# Patient Record
Sex: Female | Born: 1980 | Race: Black or African American | Hispanic: No | Smoking: Never smoker
Health system: Southern US, Community
[De-identification: ages and names within clinical notes are randomized; demographics above are authoritative.]

## PROBLEM LIST (undated history)

## (undated) DIAGNOSIS — R519 Headache, unspecified: Secondary | ICD-10-CM

## (undated) DIAGNOSIS — Z8619 Personal history of other infectious and parasitic diseases: Secondary | ICD-10-CM

## (undated) DIAGNOSIS — N390 Urinary tract infection, site not specified: Secondary | ICD-10-CM

## (undated) DIAGNOSIS — J45909 Unspecified asthma, uncomplicated: Secondary | ICD-10-CM

## (undated) DIAGNOSIS — R51 Headache: Secondary | ICD-10-CM

## (undated) HISTORY — DX: Headache, unspecified: R51.9

## (undated) HISTORY — PX: BUNIONECTOMY: SHX129

## (undated) HISTORY — DX: Headache: R51

## (undated) HISTORY — DX: Unspecified asthma, uncomplicated: J45.909

## (undated) HISTORY — DX: Personal history of other infectious and parasitic diseases: Z86.19

---

## 2001-12-03 ENCOUNTER — Encounter: Payer: Self-pay | Admitting: Emergency Medicine

## 2001-12-03 ENCOUNTER — Emergency Department (HOSPITAL_COMMUNITY): Admission: EM | Admit: 2001-12-03 | Discharge: 2001-12-03 | Payer: Self-pay | Admitting: Emergency Medicine

## 2001-12-14 ENCOUNTER — Emergency Department (HOSPITAL_COMMUNITY): Admission: EM | Admit: 2001-12-14 | Discharge: 2001-12-14 | Payer: Self-pay | Admitting: Family Medicine

## 2002-04-13 ENCOUNTER — Other Ambulatory Visit: Admission: RE | Admit: 2002-04-13 | Discharge: 2002-04-13 | Payer: Self-pay | Admitting: Obstetrics and Gynecology

## 2002-11-15 ENCOUNTER — Ambulatory Visit (HOSPITAL_COMMUNITY): Admission: RE | Admit: 2002-11-15 | Discharge: 2002-11-15 | Payer: Self-pay | Admitting: *Deleted

## 2002-11-15 ENCOUNTER — Encounter: Payer: Self-pay | Admitting: Obstetrics and Gynecology

## 2002-12-12 ENCOUNTER — Encounter: Payer: Self-pay | Admitting: Obstetrics and Gynecology

## 2002-12-12 ENCOUNTER — Ambulatory Visit (HOSPITAL_COMMUNITY): Admission: RE | Admit: 2002-12-12 | Discharge: 2002-12-12 | Payer: Self-pay | Admitting: Obstetrics and Gynecology

## 2002-12-14 ENCOUNTER — Inpatient Hospital Stay (HOSPITAL_COMMUNITY): Admission: AD | Admit: 2002-12-14 | Discharge: 2002-12-16 | Payer: Self-pay | Admitting: Obstetrics and Gynecology

## 2002-12-18 ENCOUNTER — Encounter: Admission: RE | Admit: 2002-12-18 | Discharge: 2003-01-17 | Payer: Self-pay | Admitting: Obstetrics and Gynecology

## 2003-01-10 ENCOUNTER — Other Ambulatory Visit: Admission: RE | Admit: 2003-01-10 | Discharge: 2003-01-10 | Payer: Self-pay | Admitting: Obstetrics and Gynecology

## 2012-07-15 ENCOUNTER — Encounter (HOSPITAL_COMMUNITY): Payer: Self-pay | Admitting: Emergency Medicine

## 2012-07-15 ENCOUNTER — Emergency Department (INDEPENDENT_AMBULATORY_CARE_PROVIDER_SITE_OTHER)
Admission: EM | Admit: 2012-07-15 | Discharge: 2012-07-15 | Disposition: A | Discharge status: Home / Self Care | Payer: Commercial Managed Care - PPO | Source: Home / Self Care

## 2012-07-15 DIAGNOSIS — B029 Zoster without complications: Secondary | ICD-10-CM

## 2012-07-15 MED ORDER — VALACYCLOVIR HCL 1 G PO TABS: 1000.0000 mg | ORAL_TABLET | Freq: Three times a day (TID) | ORAL | Status: AC

## 2012-07-15 MED ORDER — LIDOCAINE 5 % EX PTCH: 1.0000 | MEDICATED_PATCH | CUTANEOUS | Status: DC

## 2012-07-15 NOTE — ED Provider Notes (Signed)
Medical screening examination/treatment/procedure(s) were performed by resident physician or non-physician practitioner and as supervising physician I was immediately available for consultation/collaboration.   Barkley Bruns MD.    Linna Hoff, MD 07/15/12 2032

## 2012-07-15 NOTE — ED Notes (Signed)
tefla 3x8 used to cover rash.

## 2012-07-15 NOTE — ED Notes (Signed)
Pt is here for a rash posterior of left leg since last Wednesday  Sx include itching, painful Has been applying neosporin for the discomfort w/no relief Denies: f/v/n/d Hx of varicella   She is alert w/no signs of acute distress.

## 2012-07-15 NOTE — ED Provider Notes (Signed)
History     CSN: [id number]  Arrival date & time 07/15/12  1041   None     Chief Complaint  Patient presents with  . Rash    (Consider location/radiation/quality/duration/timing/severity/associated sxs/prior treatment) HPI Comments: 32 year old female that developed a small rash on the left posterior thigh 2 days ago. It started out as small red bumps that were itching. Now there is a crop of bumps that is sore, tender in tingling. It is located to the left posterior thigh near her the midline. She has a history of shingles that was located on the lower lumbar spine some years ago. No constitutional symptoms.   History reviewed. No pertinent past medical history.  History reviewed. No pertinent past surgical history.  No family history on file.  History  Substance Use Topics  . Smoking status: Never Smoker   . Smokeless tobacco: Not on file  . Alcohol Use: No    OB History    Grav Para Term Preterm Abortions TAB SAB Ect Mult Living                  Review of Systems  Skin:       As per history of present illness. No other rashes to  All other systems reviewed and are negative.    Allergies  Review of patient's allergies indicates no known allergies.  Home Medications   Current Outpatient Rx  Name  Route  Sig  Dispense  Refill  . LIDOCAINE 5 % EX PTCH   Transdermal   Place 1 patch onto the skin daily. Remove & Discard patch within 12 hours or as directed by MD   30 patch   0   . VALACYCLOVIR HCL 1 G PO TABS   Oral   Take 1 tablet (1,000 mg total) by mouth 3 (three) times daily.   21 tablet   0     BP 117/60  Pulse 76  Temp 98.8 F (37.1 C) (Oral)  Resp 18  SpO2 100%  LMP 06/29/2012  Physical Exam  Constitutional: She is oriented to person, place, and time. She appears well-developed and well-nourished. No distress.  Neck: Normal range of motion. Neck supple.  Pulmonary/Chest: Effort normal.  Musculoskeletal: Normal range of motion. She  exhibits no edema.  Neurological: She is alert and oriented to person, place, and time. She exhibits normal muscle tone. Coordination normal.  Skin: Skin is warm and dry.       There is a crop of papulovesicular lesions in area with a diameter of approximately 3 cm with an underlying cutaneous erythema extending beyond the vesicles. The area is tender.  Psychiatric: She has a normal mood and affect.    ED Course  Procedures (including critical care time)  Labs Reviewed - No data to display No results found.   1. Herpes zoster       MDM  Valtrex 1 g 3 times a day for 7 days Lidoderm patch q. 12 hours, one per day as needed And keep area covered for protection from clothing Told your physician if needed for worsening new symptoms or problems        Hayden Rasmussen, NP 07/15/12 1221

## 2012-11-04 ENCOUNTER — Emergency Department (HOSPITAL_COMMUNITY)
Admission: EM | Admit: 2012-11-04 | Discharge: 2012-11-04 | Disposition: A | Discharge status: Home / Self Care | Payer: Commercial Managed Care - PPO | Source: Home / Self Care

## 2012-11-04 ENCOUNTER — Encounter (HOSPITAL_COMMUNITY): Payer: Self-pay | Admitting: Emergency Medicine

## 2012-11-04 DIAGNOSIS — J3489 Other specified disorders of nose and nasal sinuses: Secondary | ICD-10-CM

## 2012-11-04 MED ORDER — AMOXICILLIN 500 MG PO CAPS: 500.0000 mg | ORAL_CAPSULE | Freq: Three times a day (TID) | ORAL | Status: DC

## 2012-11-04 NOTE — ED Notes (Signed)
Patient complains of sinus pain, drainage, and pressure.  Pain in center of face, particularly left side of face/nose.  Reports runny nose and body aches, and denies fever.

## 2012-11-04 NOTE — ED Provider Notes (Signed)
History     CSN: [id number]  Arrival date & time 11/04/12  1604   None     Chief Complaint  Patient presents with  . Facial Pain    (Consider location/radiation/quality/duration/timing/severity/associated sxs/prior treatment) Patient is a 32 y.o. female presenting with cough. The history is provided by the patient. No language interpreter was used.  Cough Cough characteristics:  Non-productive Severity:  Moderate Onset quality:  Gradual Timing:  Constant Progression:  Worsening Chronicity:  New Relieved by:  Nothing Worsened by:  Nothing tried Associated symptoms: shortness of breath, sinus congestion and sore throat   Associated symptoms: no chest pain    Pt complains of sinus congestion and drainage.  Pt complains of facial pain.   Pt has tried Careers adviser without relief No past medical history on file.  No past surgical history on file.  No family history on file.  History  Substance Use Topics  . Smoking status: Never Smoker   . Smokeless tobacco: Not on file  . Alcohol Use: No    OB History   Grav Para Term Preterm Abortions TAB SAB Ect Mult Living                  Review of Systems  HENT: Positive for sore throat.   Respiratory: Positive for cough and shortness of breath.   Cardiovascular: Negative for chest pain.  All other systems reviewed and are negative.    Allergies  Review of patient's allergies indicates no known allergies.  Home Medications   Current Outpatient Rx  Name  Route  Sig  Dispense  Refill  . lidocaine (LIDODERM) 5 %   Transdermal   Place 1 patch onto the skin daily. Remove & Discard patch within 12 hours or as directed by MD   30 patch   0     BP 133/73  Pulse 64  Temp(Src) 97.7 F (36.5 C) (Oral)  Resp 16  SpO2 100%  Physical Exam  Vitals reviewed. Constitutional: She is oriented to person, place, and time. She appears well-developed and well-nourished.  HENT:  Head: Normocephalic.  Right Ear: External ear  normal.  Left Ear: External ear normal.  Mouth/Throat: Oropharynx is clear and moist.  Tender bilat maxillay sinuses  Eyes: Conjunctivae are normal. Pupils are equal, round, and reactive to light.  Neck: Normal range of motion. Neck supple.  Cardiovascular: Normal rate.   Pulmonary/Chest: Effort normal.  Neurological: She is alert and oriented to person, place, and time.  Skin: Skin is warm.  Psychiatric: She has a normal mood and affect.    ED Course  Procedures (including critical care time)  Labs Reviewed - No data to display No results found.   1. Sinus pain       MDM  Amoxicillian Pt advised to take zyrtec,        Elson Areas, PA-C 11/04/12 screening examination/treatment/ procedure(s) were performed by non-physician practitioner and as supervising physician I was immediately available for consultations/colaborattion.   [employer] [place],M.D.  Duwayne Heck de Marcello Moores, MD 11/04/12 [phone]

## 2012-12-15 ENCOUNTER — Emergency Department (HOSPITAL_COMMUNITY)
Admission: EM | Admit: 2012-12-15 | Discharge: 2012-12-15 | Disposition: A | Discharge status: Home / Self Care | Payer: Commercial Managed Care - PPO | Source: Home / Self Care | Attending: Emergency Medicine | Admitting: Emergency Medicine

## 2012-12-15 ENCOUNTER — Encounter (HOSPITAL_COMMUNITY): Payer: Self-pay | Admitting: Emergency Medicine

## 2012-12-15 DIAGNOSIS — B029 Zoster without complications: Secondary | ICD-10-CM

## 2012-12-15 MED ORDER — LIDOCAINE 5 % EX PTCH: 1.0000 | MEDICATED_PATCH | CUTANEOUS | Status: DC

## 2012-12-15 MED ORDER — VALACYCLOVIR HCL 1 G PO TABS: 1000.0000 mg | ORAL_TABLET | Freq: Three times a day (TID) | ORAL | Status: AC

## 2012-12-15 NOTE — ED Notes (Signed)
When obtaining vitals, patient requested to not sit, wanted to stand due to the rash on the posterior legs. Vitals obtained standing

## 2012-12-15 NOTE — ED Provider Notes (Signed)
History    CSN: [id number] Arrival date & time 12/15/12  0914  First MD Initiated Contact with Patient 12/15/12 [phone]     Chief Complaint  Patient presents with  . Herpes Zoster   (Consider location/radiation/quality/duration/timing/severity/associated sxs/prior Treatment) HPI Comments: 32 year old female presents complaining of outbreak of shingles. She was seen for this originally back in January. She states that this new outbreak is in the exact same place on the back of her left leg. She denies any differences between the last outbreak and this. She denies any systemic symptoms including no fever, chills, or rash elsewhere.  History reviewed. No pertinent past medical history. History reviewed. No pertinent past surgical history. No family history on file. History  Substance Use Topics  . Smoking status: Never Smoker   . Smokeless tobacco: Not on file  . Alcohol Use: No   OB History   Grav Para Term Preterm Abortions TAB SAB Ect Mult Living                 Review of Systems  Constitutional: Negative for fever and chills.  Eyes: Negative for visual disturbance.  Respiratory: Negative for cough and shortness of breath.   Cardiovascular: Negative for chest pain, palpitations and leg swelling.  Gastrointestinal: Negative for nausea, vomiting and abdominal pain.  Endocrine: Negative for polydipsia and polyuria.  Genitourinary: Negative for dysuria, urgency and frequency.  Musculoskeletal: Negative for myalgias and arthralgias.  Skin: Positive for rash.  Neurological: Negative for dizziness, weakness and light-headedness.    Allergies  Review of patient's allergies indicates no known allergies.  Home Medications   Current Outpatient Rx  Name  Route  Sig  Dispense  Refill  . amoxicillin (AMOXIL) 500 MG capsule   Oral   Take 1 capsule (500 mg total) by mouth 3 (three) times daily.   30 capsule   0   . lidocaine (LIDODERM) 5 %   Transdermal   Place 1 patch onto the  skin daily. Remove & Discard patch within 12 hours or as directed by MD   30 patch   0   . valACYclovir (VALTREX) 1000 MG tablet   Oral   Take 1 tablet (1,000 mg total) by mouth 3 (three) times daily.   21 tablet   0    BP 166/79  Pulse 75  Temp(Src) 99 F (37.2 C) (Oral)  Resp 18  LMP 11/26/2012 Physical Exam  Nursing note and vitals reviewed. Constitutional: She is oriented to person, place, and time. Vital signs are normal. She appears well-developed and well-nourished. No distress.  HENT:  Head: Atraumatic.  Eyes: EOM are normal. Pupils are equal, round, and reactive to light.  Pulmonary/Chest: Effort normal. No respiratory distress.  Neurological: She is alert and oriented to person, place, and time. She has normal strength.  Skin: Skin is warm and dry. Rash noted. Rash is vesicular (confluence of erythematous vesicles on the upper left thigh). She is not diaphoretic.  Psychiatric: She has a normal mood and affect. Her behavior is normal. Judgment normal.    ED Course  Procedures (including critical care time) Labs Reviewed - No data to display No results found. 1. Shingles     MDM  We'll treat with 3 times a day Valtrex as well as Lidoderm patch. She will followup with her primary care provider after this has resolved for consideration for Zostavax.   Meds ordered this encounter  Medications  . lidocaine (LIDODERM) 5 %    Sig: Place 1  patch onto the skin daily. Remove & Discard patch within 12 hours or as directed by MD    Dispense:  30 patch    Refill:  0    Order Specific Question:  Supervising Provider    Answer:  Linna Hoff [phone]  . valACYclovir (VALTREX) 1000 MG tablet    Sig: Take 1 tablet (1,000 mg total) by mouth 3 (three) times daily.    Dispense:  21 tablet    Refill:  0     Graylon Good, PA-C 12/15/12 1009

## 2012-12-15 NOTE — ED Provider Notes (Signed)
Medical screening examination/treatment/procedure(s) were performed by non-physician practitioner and as supervising physician I was immediately available for consultation/collaboration.  Leslee Home, M.D.  Reuben Likes, MD 12/15/12 1311

## 2012-12-15 NOTE — ED Notes (Addendum)
C/o shingles.  Patient has a small area on posterior left thigh reported as painful and recurrent.  Last episode in february

## 2013-03-27 ENCOUNTER — Encounter (HOSPITAL_COMMUNITY): Payer: Self-pay | Admitting: Emergency Medicine

## 2013-03-27 ENCOUNTER — Emergency Department (INDEPENDENT_AMBULATORY_CARE_PROVIDER_SITE_OTHER)
Admission: EM | Admit: 2013-03-27 | Discharge: 2013-03-27 | Disposition: A | Discharge status: Home / Self Care | Payer: Commercial Managed Care - PPO | Source: Home / Self Care | Attending: Emergency Medicine | Admitting: Emergency Medicine

## 2013-03-27 DIAGNOSIS — N3000 Acute cystitis without hematuria: Secondary | ICD-10-CM

## 2013-03-27 HISTORY — DX: Urinary tract infection, site not specified: N39.0

## 2013-03-27 LAB — POCT URINALYSIS DIP (DEVICE)
Bilirubin Urine: NEGATIVE
Glucose, UA: NEGATIVE mg/dL
Hgb urine dipstick: NEGATIVE
Nitrite: NEGATIVE
Specific Gravity, Urine: >=1.03 (ref 1.005–1.030)

## 2013-03-27 LAB — POCT PREGNANCY, URINE: Preg Test, Ur: NEGATIVE

## 2013-03-27 MED ORDER — CEPHALEXIN 500 MG PO CAPS: 500.0000 mg | ORAL_CAPSULE | Freq: Three times a day (TID) | ORAL | Status: DC

## 2013-03-27 MED ORDER — PHENAZOPYRIDINE HCL 200 MG PO TABS: 200.0000 mg | ORAL_TABLET | Freq: Three times a day (TID) | ORAL | Status: DC | PRN

## 2013-03-27 NOTE — ED Provider Notes (Signed)
Chief Complaint:   Chief Complaint  Patient presents with  . Urinary Tract Infection    History of Present Illness:   Whitney [name] is a 32 year old female who has had a six-day history of urinary frequency, dysuria, and a small amount of blood in her urine. She's had some lower abdominal and lower back pain and felt chilled. She denies any fever, nausea, vomiting, or GYN complaints. Her last menstrual period was one week ago. She is sexually active and has a Mirena intrauterine device. She had a urinary tract infection about 5 years ago.  Review of Systems:  Other than noted above, the patient denies any of the following symptoms: General:  No fevers, chills, sweats, aches, or fatigue. GI:  No abdominal pain, back pain, nausea, vomiting, diarrhea, or constipation. GU:  No dysuria, frequency, urgency, hematuria, or incontinence. GYN:  No discharge, itching, vulvar pain or lesions, pelvic pain, or abnormal vaginal bleeding.  PMFSH:  Past medical history, family history, social history, meds, and allergies were reviewed.    Physical Exam:   Vital signs:  BP 124/92  Pulse 62  Temp(Src) 99 F (37.2 C) (Oral)  Resp 16  SpO2 100%  LMP 03/27/2013 Gen:  Alert, oriented, in no distress. Lungs:  Clear to auscultation, no wheezes, rales or rhonchi. Heart:  Regular rhythm, no gallop or murmer. Abdomen:  Flat and soft. There was slight suprapubic pain to palpation.  No guarding, or rebound.  No hepato-splenomegaly or mass.  Bowel sounds were normally active.  No hernia. Back:  No CVA tenderness.  Skin:  Clear, warm and dry.  Labs:    Results for orders placed during the hospital encounter of 03/27/13  POCT URINALYSIS DIP (DEVICE)      Result Value Range   Glucose, UA NEGATIVE  NEGATIVE mg/dL   Bilirubin Urine NEGATIVE  NEGATIVE   Ketones, ur NEGATIVE  NEGATIVE mg/dL   Specific Gravity, Urine >=1.030  1.005 - 1.030   Hgb urine dipstick NEGATIVE  NEGATIVE   pH 6.0  5.0 - 8.0   Protein, ur  NEGATIVE  NEGATIVE mg/dL   Urobilinogen, UA 0.2  0.0 - 1.0 mg/dL   Nitrite NEGATIVE  NEGATIVE   Leukocytes, UA TRACE (*) NEGATIVE  POCT PREGNANCY, URINE      Result Value Range   Preg Test, Ur NEGATIVE  NEGATIVE     A urine culture was obtained.  Results are pending at this time and we will call about any positive results.  Assessment: The encounter diagnosis was Acute cystitis.   No evidence of pyelonephritis.  Plan:   1.  Meds:  The following meds were prescribed:   Discharge Medication List as of 03/27/2013  4:56 PM    START taking these medications   Details  cephALEXin (KEFLEX) 500 MG capsule Take 1 capsule (500 mg total) by mouth 3 (three) times daily., Starting 03/27/2013, Until Discontinued, Normal    phenazopyridine (PYRIDIUM) 200 MG tablet Take 1 tablet (200 mg total) by mouth 3 (three) times daily as needed for pain., Starting 03/27/2013, Until Discontinued, Normal        2.  Patient Education/Counseling:  The patient was given appropriate handouts, self care instructions, and instructed in symptomatic relief. The patient was told to avoid intercourse for 10 days, get extra fluids, and return for a follow up with her primary care doctor at the completion of treatment for a repeat UA and culture.    3.  Follow up:  The patient was  told to follow up if no better in 3 to 4 days, if becoming worse in any way, and given some red flag symptoms such as fever, vomiting, or increasing pain which would prompt immediate return.  Follow up here if necessary.     Reuben Likes, MD 03/27/13 1728

## 2013-03-27 NOTE — ED Notes (Signed)
uti onset Wednesday, history of the same. Reports initially feltburning with urination, blood in urine, nausea, chills and now frequency.

## 2013-03-29 LAB — URINE CULTURE: Special Requests: NORMAL

## 2013-03-29 NOTE — ED Notes (Signed)
Urine culture: >100,000 colonies E. Coli.  Pt. treated adequately with Keflex.   Whitney [name] 03/29/2013

## 2013-05-15 ENCOUNTER — Other Ambulatory Visit: Payer: Self-pay | Admitting: Obstetrics and Gynecology

## 2014-01-30 LAB — HM PAP SMEAR: HM Pap smear: NEGATIVE

## 2014-08-03 ENCOUNTER — Emergency Department (HOSPITAL_COMMUNITY)
Admission: EM | Admit: 2014-08-03 | Discharge: 2014-08-03 | Disposition: A | Discharge status: Home / Self Care | Payer: Commercial Managed Care - PPO | Source: Home / Self Care | Attending: Family Medicine | Admitting: Family Medicine

## 2014-08-03 ENCOUNTER — Encounter (HOSPITAL_COMMUNITY): Payer: Self-pay | Admitting: Emergency Medicine

## 2014-08-03 DIAGNOSIS — L03115 Cellulitis of right lower limb: Secondary | ICD-10-CM

## 2014-08-03 NOTE — ED Notes (Signed)
Pt states that she believes she was bite by a spider on 07/26/2014 pt states se didn't actually see if it was spider but she felt something bite her leg. Pt has small scab on lower right leg.

## 2014-08-03 NOTE — ED Provider Notes (Signed)
CSN: [id number]     Arrival date & time 08/03/14  0844 History   First MD Initiated Contact with Patient 08/03/14 [phone]     Chief Complaint  Patient presents with  . Insect Bite    spider bite   (Consider location/radiation/quality/duration/timing/severity/associated sxs/prior Treatment) HPI      34 year old female presents for evaluation of possible spider bite. She was bitten on her right shin a week ago.  It became swollen, erythematous, and tender. She squeezed it and a large amount of pus came out. It is now getting better. She decided to come in because her friends told her that if she didnt get treated her leg might have to be amputated. She now has minimal pain, no systemic symptoms whatsoever.  Past Medical History  Diagnosis Date  . UTI (lower urinary tract infection)    History reviewed. No pertinent past surgical history. History reviewed. No pertinent family history. History  Substance Use Topics  . Smoking status: Never Smoker   . Smokeless tobacco: Not on file  . Alcohol Use: No   OB History    No data available     Review of Systems  Skin: Positive for wound (right shin ).  All other systems reviewed and are negative.   Allergies  Review of patient's allergies indicates no known allergies.  Home Medications   Prior to Admission medications   Medication Sig Start Date End Date Taking? Authorizing Provider  amoxicillin (AMOXIL) 500 MG capsule Take 1 capsule (500 mg total) by mouth 3 (three) times daily. 11/04/12   Fransico Meadow, PA-C  cephALEXin (KEFLEX) 500 MG capsule Take 1 capsule (500 mg total) by mouth 3 (three) times daily. 03/27/13   Harden Mo, MD  lidocaine (LIDODERM) 5 % Place 1 patch onto the skin daily. Remove & Discard patch within 12 hours or as directed by MD 12/15/12   Liam Graham, PA-C  phenazopyridine (PYRIDIUM) 200 MG tablet Take 1 tablet (200 mg total) by mouth 3 (three) times daily as needed for pain. 03/27/13   Harden Mo, MD    BP 114/51 mmHg  Pulse 69  Temp(Src) 97.4 F (36.3 C) (Oral)  Resp 18  SpO2 98%  LMP 07/09/2014 Physical Exam  Constitutional: She is oriented to person, place, and time. Vital signs are normal. She appears well-developed and well-nourished. No distress.  HENT:  Head: Normocephalic and atraumatic.  Pulmonary/Chest: Effort normal. No respiratory distress.  Neurological: She is alert and oriented to person, place, and time. She has normal strength. Coordination normal.  Skin: Skin is warm and dry. Lesion (on the right shin, there is a small 3 mm diameter scab with very minimal surrounding erythema and induration, minimally tender. She confirms that this appears much better than it has over the past few days and it is getting better every day.) noted. No rash noted. She is not diaphoretic.  Psychiatric: She has a normal mood and affect. Judgment normal.  Nursing note and vitals reviewed.   ED Course  Procedures (including critical care time) Labs Review Labs Reviewed - No data to display  Imaging Review No results found.   MDM   1. Cellulitis of right lower extremity    Advised warm compresses, elevation, and follow-up if it starts to worsen at all. No antibiotics indicated at this time      Liam Graham, PA-C 08/03/14 2836

## 2014-08-03 NOTE — Discharge Instructions (Signed)
Use warm compresses 3 times daily. Come back if this starts getting worse to be reevaluated and considered for antibiotics  Cellulitis Cellulitis is an infection of the skin and the tissue beneath it. The infected area is usually red and tender. Cellulitis occurs most often in the arms and lower legs.  CAUSES  Cellulitis is caused by bacteria that enter the skin through cracks or cuts in the skin. The most common types of bacteria that cause cellulitis are staphylococci and streptococci. SIGNS AND SYMPTOMS   Redness and warmth.  Swelling.  Tenderness or pain.  Fever. DIAGNOSIS  Your health care provider can usually determine what is wrong based on a physical exam. Blood tests may also be done. TREATMENT  Treatment usually involves taking an antibiotic medicine. HOME CARE INSTRUCTIONS   Take your antibiotic medicine as directed by your health care provider. Finish the antibiotic even if you start to feel better.  Keep the infected arm or leg elevated to reduce swelling.  Apply a warm cloth to the affected area up to 4 times per day to relieve pain.  Take medicines only as directed by your health care provider.  Keep all follow-up visits as directed by your health care provider. SEEK MEDICAL CARE IF:   You notice red streaks coming from the infected area.  Your red area gets larger or turns dark in color.  Your bone or joint underneath the infected area becomes painful after the skin has healed.  Your infection returns in the same area or another area.  You notice a swollen bump in the infected area.  You develop new symptoms.  You have a fever. SEEK IMMEDIATE MEDICAL CARE IF:   You feel very sleepy.  You develop vomiting or diarrhea.  You have a general ill feeling (malaise) with muscle aches and pains. MAKE SURE YOU:   Understand these instructions.  Will watch your condition.  Will get help right away if you are not doing well or get worse. Document  Released: 03/11/2005 Document Revised: 10/16/2013 Document Reviewed: 08/17/2011 [facility] Patient Information 2015 [place], [place]. This information is not intended to replace advice given to you by your health care provider. Make sure you discuss any questions you have with your health care provider.  Abscess An abscess is an infected area that contains a collection of pus and debris.It can occur in almost any part of the body. An abscess is also known as a furuncle or boil. CAUSES  An abscess occurs when tissue gets infected. This can occur from blockage of oil or sweat glands, infection of hair follicles, or a minor injury to the skin. As the body tries to fight the infection, pus collects in the area and creates pressure under the skin. This pressure causes pain. People with weakened immune systems have difficulty fighting infections and get certain abscesses more often.  SYMPTOMS Usually an abscess develops on the skin and becomes a painful mass that is red, warm, and tender. If the abscess forms under the skin, you may feel a moveable soft area under the skin. Some abscesses break open (rupture) on their own, but most will continue to get worse without care. The infection can spread deeper into the body and eventually into the bloodstream, causing you to feel ill.  DIAGNOSIS  Your caregiver will take your medical history and perform a physical exam. A sample of fluid may also be taken from the abscess to determine what is causing your infection. TREATMENT  Your caregiver may prescribe  antibiotic medicines to fight the infection. However, taking antibiotics alone usually does not cure an abscess. Your caregiver may need to make a small cut (incision) in the abscess to drain the pus. In some cases, gauze is packed into the abscess to reduce pain and to continue draining the area. HOME CARE INSTRUCTIONS   Only take over-the-counter or prescription medicines for pain, discomfort, or fever as  directed by your caregiver.  If you were prescribed antibiotics, take them as directed. Finish them even if you start to feel better.  If gauze is used, follow your caregiver's directions for changing the gauze.  To avoid spreading the infection:  Keep your draining abscess covered with a bandage.  Wash your hands well.  Do not share personal care items, towels, or whirlpools with others.  Avoid skin contact with others.  Keep your skin and clothes clean around the abscess.  Keep all follow-up appointments as directed by your caregiver. SEEK MEDICAL CARE IF:   You have increased pain, swelling, redness, fluid drainage, or bleeding.  You have muscle aches, chills, or a general ill feeling.  You have a fever. MAKE SURE YOU:   Understand these instructions.  Will watch your condition.  Will get help right away if you are not doing well or get worse. Document Released: 03/11/2005 Document Revised: 12/01/2011 Document Reviewed: 08/14/2011 [facility] Patient Information 2015 [place], [place]. This information is not intended to replace advice given to you by your health care provider. Make sure you discuss any questions you have with your health care provider.

## 2014-12-07 ENCOUNTER — Emergency Department (HOSPITAL_COMMUNITY)
Admission: EM | Admit: 2014-12-07 | Discharge: 2014-12-07 | Discharge status: Absent without leave | Payer: Commercial Managed Care - PPO | Source: Home / Self Care

## 2015-03-03 ENCOUNTER — Emergency Department (INDEPENDENT_AMBULATORY_CARE_PROVIDER_SITE_OTHER)
Admission: EM | Admit: 2015-03-03 | Discharge: 2015-03-03 | Disposition: A | Discharge status: Home / Self Care | Payer: Commercial Managed Care - PPO | Source: Home / Self Care

## 2015-03-03 ENCOUNTER — Encounter (HOSPITAL_COMMUNITY): Payer: Self-pay | Admitting: Emergency Medicine

## 2015-03-03 DIAGNOSIS — G43809 Other migraine, not intractable, without status migrainosus: Secondary | ICD-10-CM

## 2015-03-03 MED ORDER — SUMATRIPTAN SUCCINATE 50 MG PO TABS: ORAL_TABLET | ORAL | Status: DC

## 2015-03-03 MED ORDER — PROMETHAZINE HCL 25 MG PO TABS: 25.0000 mg | ORAL_TABLET | Freq: Four times a day (QID) | ORAL | Status: DC | PRN

## 2015-03-03 NOTE — Discharge Instructions (Signed)

## 2015-03-03 NOTE — ED Provider Notes (Signed)
CSN: [id number]     Arrival date & time 03/03/15  1746 History   None    Chief Complaint  Patient presents with  . Dizziness  . Migraine   (Consider location/radiation/quality/duration/timing/severity/associated sxs/prior Treatment) Patient is a 34 y.o. female presenting with headaches. The history is provided by the patient. No language interpreter was used.  Headache Pain location:  Frontal (B/L temporal and frontal) Quality:  Dull Severity currently:  8/10 Onset quality:  Gradual Duration:  2 weeks Timing:  Intermittent Progression:  Waxing and waning Chronicity:  Recurrent Similar to prior headaches: yes (This is worse)   Context: activity   Context: not exposure to bright light and not loud noise   Context comment:  Stress at work Relieved by:  NSAIDs (Ibuprofen 800mg ) Worsened by:  Activity Ineffective treatments:  NSAIDs Associated symptoms: dizziness   Associated symptoms: no blurred vision, no fever, no neck stiffness, no numbness, no photophobia and no seizures   Associated symptoms comment:  Feels dizzy intermittently.  She also mentioned she recently changes her eye glasses and contact lens few days ago and she is now adjusting to her new lenses. NB: On Mirena for birth control per patient.  Past Medical History  Diagnosis Date  . UTI (lower urinary tract infection)    History reviewed. No pertinent past surgical history. No family history on file. Social History  Substance Use Topics  . Smoking status: Never Smoker   . Smokeless tobacco: None  . Alcohol Use: No   OB History    No data available     Review of Systems  Constitutional: Negative for fever.  Eyes: Negative for blurred vision and photophobia.  Respiratory: Negative.   Cardiovascular: Negative.   Musculoskeletal: Negative for neck stiffness.  Neurological: Positive for dizziness and headaches. Negative for tremors, seizures and numbness.  All other systems reviewed and are  negative.   Allergies  Review of patient's allergies indicates no known allergies.  Home Medications   Prior to Admission medications   Medication Sig Start Date End Date Taking? Authorizing Provider  amoxicillin (AMOXIL) 500 MG capsule Take 1 capsule (500 mg total) by mouth 3 (three) times daily. 11/04/12   Fransico Meadow, PA-C  cephALEXin (KEFLEX) 500 MG capsule Take 1 capsule (500 mg total) by mouth 3 (three) times daily. 03/27/13   Harden Mo, MD  lidocaine (LIDODERM) 5 % Place 1 patch onto the skin daily. Remove & Discard patch within 12 hours or as directed by MD 12/15/12   Liam Graham, PA-C  phenazopyridine (PYRIDIUM) 200 MG tablet Take 1 tablet (200 mg total) by mouth 3 (three) times daily as needed for pain. 03/27/13   Harden Mo, MD   Meds Ordered and Administered this Visit  Medications - No data to display  BP 101/57 mmHg  Pulse 60  Temp(Src) 98.7 F (37.1 C) (Oral)  Resp 16  SpO2 100% No data found.   Physical Exam  Constitutional: She is oriented to person, place, and time. She appears well-developed. No distress.  HENT:  Right Ear: Tympanic membrane, external ear and ear canal normal.  Left Ear: Tympanic membrane, external ear and ear canal normal.  Eyes: Conjunctivae and EOM are normal. Pupils are equal, round, and reactive to light.  Fundoscopic exam:      The right eye shows no hemorrhage and no papilledema.       The left eye shows no hemorrhage and no papilledema.  Cardiovascular: Normal rate, regular rhythm, normal  heart sounds and intact distal pulses.   No murmur heard. Pulmonary/Chest: Effort normal and breath sounds normal. No respiratory distress. She has no wheezes.  Abdominal: Soft. Bowel sounds are normal. She exhibits no distension. There is no tenderness.  Musculoskeletal: Normal range of motion.  Neurological: She is alert and oriented to person, place, and time. She has normal strength and normal reflexes. She is not disoriented. No  cranial nerve deficit or sensory deficit. She displays a negative Romberg sign. GCS eye subscore is 4. GCS verbal subscore is 5. GCS motor subscore is 6. She displays no Babinski's sign on the right side. She displays no Babinski's sign on the left side.  Neg Kernig's sign.  Nursing note and vitals reviewed.   ED Course  Procedures (including critical care time)  Labs Review Labs Reviewed - No data to display  Imaging Review No results found.   Visual Acuity Review  Right Eye Distance:   Left Eye Distance:   Bilateral Distance:    Right Eye Near:   Left Eye Near:    Bilateral Near:         MDM  No diagnosis found. Migraine Headache  Patient likely having migraine headache vs due to not wearing corrected lenses for her vision vs NSAID overuse. For now I will treat her for Migraine. Trial of Imitrex recommended. Prescription given. I also gave Phenergan for her nausea. Patient advised to keep hydrated and rest at home. Return precaution discussed.  Kinnie Feil, MD 03/04/15 [phone]

## 2015-03-03 NOTE — ED Notes (Signed)
Patient reports she has felt dizzy with nausea x 2 weeks. She also c/o headaches with tension behind her eyes. She has been switching between glasses and contacts lately. Patient is in NAD.

## 2016-04-09 ENCOUNTER — Ambulatory Visit (INDEPENDENT_AMBULATORY_CARE_PROVIDER_SITE_OTHER): Payer: Commercial Managed Care - PPO | Admitting: Nurse Practitioner

## 2016-04-09 ENCOUNTER — Other Ambulatory Visit (INDEPENDENT_AMBULATORY_CARE_PROVIDER_SITE_OTHER): Payer: Commercial Managed Care - PPO

## 2016-04-09 ENCOUNTER — Encounter: Payer: Self-pay | Admitting: Nurse Practitioner

## 2016-04-09 VITALS — BP 116/89 | Temp 97.6°F | Ht 67.0 in | Wt 223.0 lb

## 2016-04-09 DIAGNOSIS — Z23 Encounter for immunization: Secondary | ICD-10-CM | POA: Diagnosis not present

## 2016-04-09 DIAGNOSIS — Z0000 Encounter for general adult medical examination without abnormal findings: Secondary | ICD-10-CM

## 2016-04-09 DIAGNOSIS — R8290 Unspecified abnormal findings in urine: Secondary | ICD-10-CM

## 2016-04-09 DIAGNOSIS — Z87898 Personal history of other specified conditions: Secondary | ICD-10-CM | POA: Diagnosis not present

## 2016-04-09 DIAGNOSIS — N921 Excessive and frequent menstruation with irregular cycle: Secondary | ICD-10-CM

## 2016-04-09 DIAGNOSIS — R102 Pelvic and perineal pain: Secondary | ICD-10-CM

## 2016-04-09 DIAGNOSIS — Z0001 Encounter for general adult medical examination with abnormal findings: Secondary | ICD-10-CM | POA: Diagnosis not present

## 2016-04-09 DIAGNOSIS — G43009 Migraine without aura, not intractable, without status migrainosus: Secondary | ICD-10-CM | POA: Diagnosis not present

## 2016-04-09 DIAGNOSIS — R1024 Suprapubic pain: Secondary | ICD-10-CM

## 2016-04-09 DIAGNOSIS — G43909 Migraine, unspecified, not intractable, without status migrainosus: Secondary | ICD-10-CM | POA: Insufficient documentation

## 2016-04-09 DIAGNOSIS — F909 Attention-deficit hyperactivity disorder, unspecified type: Secondary | ICD-10-CM | POA: Insufficient documentation

## 2016-04-09 LAB — POCT URINALYSIS DIPSTICK
BILIRUBIN UA: NEGATIVE
GLUCOSE UA: NEGATIVE
KETONES UA: NEGATIVE
LEUKOCYTES UA: NEGATIVE
NITRITE UA: NEGATIVE
Protein, UA: NEGATIVE
Spec Grav, UA: >=1.03
Urobilinogen, UA: 1
pH, UA: 6

## 2016-04-09 LAB — COMPREHENSIVE METABOLIC PANEL
ALBUMIN: 4.2 g/dL (ref 3.5–5.2)
ALK PHOS: 32 U/L — AB (ref 39–117)
ALT: 15 U/L (ref 0–35)
AST: 15 U/L (ref 0–37)
BILIRUBIN TOTAL: 0.4 mg/dL (ref 0.2–1.2)
BUN: 10 mg/dL (ref 6–23)
CALCIUM: 9.3 mg/dL (ref 8.4–10.5)
CO2: 28 meq/L (ref 19–32)
CREATININE: 0.79 mg/dL (ref 0.40–1.20)
Chloride: 105 mEq/L (ref 96–112)
GFR: 105.16 mL/min (ref >60.00)
Glucose, Bld: 102 mg/dL — ABNORMAL HIGH (ref 70–99)
Potassium: 4.1 mEq/L (ref 3.5–5.1)
Sodium: 139 mEq/L (ref 135–145)
TOTAL PROTEIN: 7.5 g/dL (ref 6.0–8.3)

## 2016-04-09 LAB — CBC WITH DIFFERENTIAL/PLATELET
BASOS ABS: 0 10*3/uL (ref 0.0–0.1)
Basophils Relative: 0.3 % (ref 0.0–3.0)
EOS ABS: 0.2 10*3/uL (ref 0.0–0.7)
Eosinophils Relative: 3.9 % (ref 0.0–5.0)
HEMATOCRIT: 37.3 % (ref 36.0–46.0)
Hemoglobin: 12.5 g/dL (ref 12.0–15.0)
LYMPHS PCT: 22.4 % (ref 12.0–46.0)
Lymphs Abs: 1.2 10*3/uL (ref 0.7–4.0)
MCHC: 33.6 g/dL (ref 30.0–36.0)
MCV: 91.3 fl (ref 78.0–100.0)
Monocytes Absolute: 0.5 10*3/uL (ref 0.1–1.0)
Monocytes Relative: 8.9 % (ref 3.0–12.0)
NEUTROS ABS: 3.5 10*3/uL (ref 1.4–7.7)
NEUTROS PCT: 64.5 % (ref 43.0–77.0)
PLATELETS: 294 10*3/uL (ref 150.0–400.0)
RBC: 4.09 Mil/uL (ref 3.87–5.11)
RDW: 13.5 % (ref 11.5–15.5)
WBC: 5.4 10*3/uL (ref 4.0–10.5)

## 2016-04-09 LAB — LIPID PANEL
CHOL/HDL RATIO: 4
Cholesterol: 168 mg/dL (ref 0–200)
HDL: 47.3 mg/dL (ref >39.00)
LDL Cholesterol: 107 mg/dL — ABNORMAL HIGH (ref 0–99)
NonHDL: 120.65
TRIGLYCERIDES: 66 mg/dL (ref 0.0–149.0)
VLDL: 13.2 mg/dL (ref 0.0–40.0)

## 2016-04-09 LAB — TSH: TSH: 1.45 u[IU]/mL (ref 0.35–4.50)

## 2016-04-09 LAB — HEMOGLOBIN A1C: Hgb A1c MFr Bld: 5.1 % (ref 4.6–6.5)

## 2016-04-09 MED ORDER — PROMETHAZINE HCL 25 MG PO TABS: 25.0000 mg | ORAL_TABLET | Freq: Three times a day (TID) | ORAL | 0 refills | Status: DC | PRN

## 2016-04-09 NOTE — Patient Instructions (Addendum)
Go to basement for lab draw. You will be called with results. Please bring previous Pap smear results to next office visit. You will be called with appointment with neuropsychology.  General Headache Without Cause A headache is pain or discomfort felt around the head or neck area. There are many causes and types of headaches. In some cases, the cause may not be found.  HOME CARE  Managing Pain  Take over-the-counter and prescription medicines only as told by your doctor.  Lie down in a dark, quiet room when you have a headache.  If directed, apply ice to the head and neck area:  Put ice in a plastic bag.  Place a towel between your skin and the bag.  Leave the ice on for 20 minutes, 2-3 times per day.  Use a heating pad or hot shower to apply heat to the head and neck area as told by your doctor.  Keep lights dim if bright lights bother you or make your headaches worse. Eating and Drinking  Eat meals on a regular schedule.  Lessen how much alcohol you drink.  Lessen how much caffeine you drink, or stop drinking caffeine. General Instructions  Keep all follow-up visits as told by your doctor. This is important.  Keep a journal to find out if certain things bring on headaches. For example, write down:  What you eat and drink.  How much sleep you get.  Any change to your diet or medicines.  Relax by getting a massage or doing other relaxing activities.  Lessen stress.  Sit up straight. Do not tighten (tense) your muscles.  Do not use tobacco products. This includes cigarettes, chewing tobacco, or e-cigarettes. If you need help quitting, ask your doctor.  Exercise regularly as told by your doctor.  Get enough sleep. This often means 7-9 hours of sleep. GET HELP IF:  Your symptoms are not helped by medicine.  You have a headache that feels different than the other headaches.  You feel sick to your stomach (nauseous) or you throw up (vomit).  You have a  fever. GET HELP RIGHT AWAY IF:   Your headache becomes really bad.  You keep throwing up.  You have a stiff neck.  You have trouble seeing.  You have trouble speaking.  You have pain in the eye or ear.  Your muscles are weak or you lose muscle control.  You lose your balance or have trouble walking.  You feel like you will pass out (faint) or you pass out.  You have confusion.   This information is not intended to replace advice given to you by your health care provider. Make sure you discuss any questions you have with your health care provider.   Document Released: 03/10/2008 Document Revised: 02/20/2015 Document Reviewed: 09/24/2014 Elsevier Interactive Patient Education [employer].

## 2016-04-09 NOTE — Progress Notes (Signed)
Normal results, see office note Patient notified about results in office

## 2016-04-09 NOTE — Progress Notes (Signed)
Pre visit review using our clinic review tool, if applicable. No additional management support is needed unless otherwise documented below in the visit note. 

## 2016-04-09 NOTE — Progress Notes (Signed)
Subjective:    Patient ID: Whitney [name], female    DOB: 09-14-1980, 35 y.o.   MRN: [id number]  Patient presents today for complete physical or establish care (new patient) and ABD pain  Headache   This is a new problem. The current episode started more than 1 month ago. The problem occurs intermittently. The problem has been resolved. The pain is located in the frontal region. The pain does not radiate. The pain quality is not similar to prior headaches. The quality of the pain is described as aching and dull. The pain is moderate. Associated symptoms include abdominal pain, anorexia, blurred vision, dizziness and nausea. Pertinent negatives include no coughing, drainage, ear pain, eye pain, eye redness, eye watering, fever, hearing loss, insomnia, muscle aches, neck pain, numbness, phonophobia, photophobia, rhinorrhea, scalp tenderness, seizures, sinus pressure, sore throat, swollen glands, tingling, tinnitus, visual change, vomiting, weakness or weight loss. Nothing aggravates the symptoms. She has tried NSAIDs for the symptoms. The treatment provided significant relief. Her past medical history is significant for obesity. There is no history of cancer, cluster headaches, hypertension, immunosuppression, migraine headaches, migraines in the family, recent head traumas, sinus disease or TMJ.  Abdominal Pain  This is a new problem. The current episode started in the past 7 days. The onset quality is gradual. The problem occurs intermittently. The problem has been waxing and waning. The pain is located in the suprapubic region. The quality of the pain is dull, cramping and a sensation of fullness. Associated symptoms include anorexia, headaches and nausea. Pertinent negatives include no constipation, diarrhea, dysuria, fever, frequency, myalgias, vomiting or weight loss. The pain is relieved by urination. She has tried nothing for the symptoms.    Immunizations: (TDAP, Hep C screen, Pneumovax,  Influenza, zoster)  Health Maintenance  Topic Date Due  . HIV Screening  12/29/1993  . Tetanus Vaccine  12/29/1997  . Pap Smear  12/30/1999  . Flu Shot  01/14/2016   Diet:healthy Weight:  Wt Readings from Last 3 Encounters:  04/09/16 223 lb (101.2 kg)   Exercise:3x/week, treadmill and weights Home Safety:home with 3children Depression/Suicide:denies No flowsheet data found. No flowsheet data found. Vision:up to date Dental:up to date Sexual History (birth control, marital status, STD):sexually active with female partner, current use of OCP (Prescribed by [facility]), chronic heavy irregular periods, denies any history of fibroids or endometriosis.  Medications and allergies reviewed with patient and updated if appropriate.  Patient Active Problem List   Diagnosis Date Noted  . Menorrhagia with irregular cycle 04/09/2016  . Migraine headache 04/09/2016  . Hyperactivity 04/09/2016  . History of motion sickness 04/09/2016    Current Outpatient Prescriptions on File Prior to Visit  Medication Sig Dispense Refill  . SUMAtriptan (IMITREX) 50 MG tablet Take one tablet now.May repeat in 2 hours if headache persists or recurs. Do not take more than 200 mg per day 6 tablet 0   No current facility-administered medications on file prior to visit.     Past Medical History:  Diagnosis Date  . Asthma   . Headache   . UTI (lower urinary tract infection)     History reviewed. No pertinent surgical history.  Social History   Social History  . Marital status: Divorced    Spouse name: N/A  . Number of children: N/A  . Years of education: N/A   Social History Main Topics  . Smoking status: Never Smoker  . Smokeless tobacco: Never Used  . Alcohol use No  .  Drug use: No  . Sexual activity: Yes    Birth control/ protection: IUD   Other Topics Concern  . None   Social History Narrative  . None    Family History  Problem Relation Age of Onset  . Hypertension  Mother   . Hypertension Father   . Diabetes Maternal Grandmother         Review of Systems  Constitutional: Negative for fever, malaise/fatigue and weight loss.  HENT: Negative for congestion, ear pain, hearing loss, rhinorrhea, sinus pressure, sore throat and tinnitus.   Eyes: Positive for blurred vision. Negative for photophobia, pain and redness.       Negative for visual changes  Respiratory: Negative for cough and shortness of breath.   Cardiovascular: Negative for chest pain, palpitations and leg swelling.  Gastrointestinal: Positive for abdominal pain, anorexia and nausea. Negative for blood in stool, constipation, diarrhea, heartburn and vomiting.  Genitourinary: Negative for dysuria, frequency and urgency.  Musculoskeletal: Negative for falls, joint pain, myalgias and neck pain.  Skin: Negative for rash.  Neurological: Positive for dizziness and headaches. Negative for tingling, sensory change, seizures, weakness and numbness.  Endo/Heme/Allergies: Does not bruise/bleed easily.  Psychiatric/Behavioral: Negative for depression, substance abuse and suicidal ideas. The patient is not nervous/anxious and does not have insomnia.     Objective:   Vitals:   04/09/16 0921  BP: 116/89  Temp: 97.6 F (36.4 C)    Body mass index is 34.93 kg/m.   Physical Examination:  Physical Exam  Constitutional: She is oriented to person, place, and time and well-developed, well-nourished, and in no distress. No distress.  HENT:  Right Ear: External ear normal.  Left Ear: External ear normal.  Nose: Nose normal.  Mouth/Throat: Oropharynx is clear and moist. No oropharyngeal exudate.  Eyes: Conjunctivae and EOM are normal. Pupils are equal, round, and reactive to light. No scleral icterus.  Neck: Normal range of motion. Neck supple. No thyromegaly present.  Cardiovascular: Normal rate, normal heart sounds and intact distal pulses.   Pulmonary/Chest: Effort normal and breath sounds  normal. She exhibits no tenderness.  Abdominal: Soft. Bowel sounds are normal. She exhibits no distension. There is tenderness.  Suprapubic  Genitourinary:  Genitourinary Comments: Patient deferred pelvic and breast exam to GYN  Musculoskeletal: Normal range of motion. She exhibits no edema or tenderness.  Lymphadenopathy:    She has no cervical adenopathy.  Neurological: She is alert and oriented to person, place, and time. Gait normal.  Skin: Skin is warm and dry.  Psychiatric: Affect and judgment normal.    ASSESSMENT and PLAN:  Denyla was seen today for headache.  Diagnoses and all orders for this visit:  Encounter for preventative adult health care exam with abnormal findings -     Comprehensive metabolic panel; Future -     CBC with Differential/Platelet; Future -     Lipid panel; Future -     TSH; Future -     Hemoglobin A1c; Future -     HIV antibody; Future  Menorrhagia with irregular cycle -     Comprehensive metabolic panel; Future -     CBC with Differential/Platelet; Future -     Hemoglobin A1c; Future  Migraine without aura and without status migrainosus, not intractable  Hyperactivity -     Ambulatory referral to Neuropsychology  Suprapubic abdominal pain -     POCT urinalysis dipstick -     Urine culture; Future  Abnormal finding on urinalysis -  Urine culture; Future  History of motion sickness -     promethazine (PHENERGAN) 25 MG tablet; Take 1 tablet (25 mg total) by mouth every 8 (eight) hours as needed for nausea or vomiting.   No problem-specific Assessment & Plan notes found for this encounter.    Follow up: Return in about 6 months (around 10/08/2016) for Headache.  Wilfred Lacy, NP

## 2016-04-10 LAB — HIV ANTIBODY (ROUTINE TESTING W REFLEX): HIV: NONREACTIVE

## 2016-04-10 LAB — URINE CULTURE

## 2016-04-10 NOTE — Progress Notes (Signed)
Normal results

## 2016-10-14 LAB — HM HIV SCREENING LAB: HM HIV Screening: NEGATIVE

## 2016-10-14 LAB — HM PAP SMEAR: HM Pap smear: NEGATIVE

## 2016-10-14 LAB — HM HEPATITIS C SCREENING LAB: HM Hepatitis Screen: NEGATIVE

## 2016-10-14 LAB — HIV ANTIBODY (ROUTINE TESTING W REFLEX): HIV 1&2 Ab, 4th Generation: NONREACTIVE

## 2017-02-23 ENCOUNTER — Ambulatory Visit: Payer: Commercial Managed Care - PPO | Admitting: Nurse Practitioner

## 2018-08-30 ENCOUNTER — Ambulatory Visit (INDEPENDENT_AMBULATORY_CARE_PROVIDER_SITE_OTHER): Payer: Commercial Managed Care - PPO | Admitting: Nurse Practitioner

## 2018-08-30 ENCOUNTER — Encounter: Payer: Self-pay | Admitting: Nurse Practitioner

## 2018-08-30 ENCOUNTER — Other Ambulatory Visit: Payer: Self-pay

## 2018-08-30 VITALS — BP 100/76 | HR 83 | Temp 99.6°F | Ht 66.75 in | Wt 234.2 lb

## 2018-08-30 DIAGNOSIS — Z23 Encounter for immunization: Secondary | ICD-10-CM

## 2018-08-30 DIAGNOSIS — F4323 Adjustment disorder with mixed anxiety and depressed mood: Secondary | ICD-10-CM

## 2018-08-30 DIAGNOSIS — F5102 Adjustment insomnia: Secondary | ICD-10-CM

## 2018-08-30 MED ORDER — TRAZODONE HCL 50 MG PO TABS: 25.0000 mg | ORAL_TABLET | Freq: Every evening | ORAL | 1 refills | Status: DC | PRN

## 2018-08-30 MED ORDER — ESCITALOPRAM OXALATE 10 MG PO TABS: 10.0000 mg | ORAL_TABLET | Freq: Every day | ORAL | 1 refills | Status: DC

## 2018-08-30 NOTE — Patient Instructions (Addendum)
I instructed pt to start 1/2 tablet once daily for 1 week and then increase to a full tablet once daily on week two as tolerated.   We discussed common side effects such as nausea, drowsiness and weight gain.  Also discussed rare but serious side effect of suicide ideation.  She is instructed to discontinue medication go directly to ED if this occurs.  Pt verbalizes understanding.   Plan follow up in 2weeks to evaluate progress.    Use trazodone at bedtime as needed Do not take if unable to obtain at least 8hrs of sleep.   Mindfulness-Based Stress Reduction Mindfulness-based stress reduction (MBSR) is a program that helps people learn to practice mindfulness. Mindfulness is the practice of intentionally paying attention to the present moment. It can be learned and practiced through techniques such as education, breathing exercises, meditation, and yoga. MBSR includes several mindfulness techniques in one program. MBSR works best when you understand the treatment, are willing to try new things, and can commit to spending time practicing what you learn. MBSR training may include learning about:  How your emotions, thoughts, and reactions affect your body.  New ways to respond to things that cause negative thoughts to start (triggers).  How to notice your thoughts and let go of them.  Practicing awareness of everyday things that you normally do without thinking.  The techniques and goals of different types of meditation. What are the benefits of MBSR? MBSR can have many benefits, which include helping you to:  Develop self-awareness. This refers to knowing and understanding yourself.  Learn skills and attitudes that help you to participate in your own health care.  Learn new ways to care for yourself.  Be more accepting about how things are, and let things go.  Be less judgmental and approach things with an open mind.  Be patient with yourself and trust yourself more. MBSR has also  been shown to:  Reduce negative emotions, such as depression and anxiety.  Improve memory and focus.  Change how you sense and approach pain.  Boost your body's ability to fight infections.  Help you connect better with other people.  Improve your sense of well-being. Follow these instructions at home:   Find a local in-person or online MBSR program.  Set aside some time regularly for mindfulness practice.  Find a mindfulness practice that works best for you. This may include one or more of the following: ? Meditation. Meditation involves focusing your mind on a certain thought or activity. ? Breathing awareness exercises. These help you to stay present by focusing on your breath. ? Body scan. For this practice, you lie down and pay attention to each part of your body from head to toe. You can identify tension and soreness and intentionally relax parts of your body. ? Yoga. Yoga involves stretching and breathing, and it can improve your ability to move and be flexible. It can also provide an experience of testing your body's limits, which can help you release stress. ? Mindful eating. This way of eating involves focusing on the taste, texture, color, and smell of each bite of food. Because this slows down eating and helps you feel full sooner, it can be an important part of a weight-loss plan.  Find a podcast or recording that provides guidance for breathing awareness, body scan, or meditation exercises. You can listen to these any time when you have a free moment to rest without distractions.  Follow your treatment plan as told by your  health care provider. This may include taking regular medicines and making changes to your diet or lifestyle as recommended. How to practice mindfulness To do a basic awareness exercise:  Find a comfortable place to sit.  Pay attention to the present moment. Observe your thoughts, feelings, and surroundings just as they are.  Avoid placing  judgment on yourself, your feelings, or your surroundings. Make note of any judgment that comes up, and let it go.  Your mind may wander, and that is okay. Make note of when your thoughts drift, and return your attention to the present moment. To do basic mindfulness meditation:  Find a comfortable place to sit. This may include a stable chair or a firm floor cushion. ? Sit upright with your back straight. Let your arms fall next to your side with your hands resting on your legs. ? If sitting in a chair, rest your feet flat on the floor. ? If sitting on a cushion, cross your legs in front of you.  Keep your head in a neutral position with your chin dropped slightly. Relax your jaw and rest the tip of your tongue on the roof of your mouth. Drop your gaze to the floor. You can close your eyes if you like.  Breathe normally and pay attention to your breath. Feel the air moving in and out of your nose. Feel your belly expanding and relaxing with each breath.  Your mind may wander, and that is okay. Make note of when your thoughts drift, and return your attention to your breath.  Avoid placing judgment on yourself, your feelings, or your surroundings. Make note of any judgment or feelings that come up, let them go, and bring your attention back to your breath.  When you are ready, lift your gaze or open your eyes. Pay attention to how your body feels after the meditation. Where to find more information You can find more information about MBSR from:  Your health care provider.  Community-based meditation centers or programs.  Programs offered near you. Summary  Mindfulness-based stress reduction (MBSR) is a program that teaches you how to intentionally pay attention to the present moment. It is used with other treatments to help you cope better with daily stress, emotions, and pain.  MBSR focuses on developing self-awareness, which allows you to respond to life stress without judgment or  negative emotions.  MBSR programs may involve learning different mindfulness practices, such as breathing exercises, meditation, yoga, body scan, or mindful eating. Find a mindfulness practice that works best for you, and set aside time for it on a regular basis. This information is not intended to replace advice given to you by your health care provider. Make sure you discuss any questions you have with your health care provider. Document Released: 10/08/2016 Document Revised: 10/08/2016 Document Reviewed: 10/08/2016 Elsevier Interactive Patient Education  2019 [employer].

## 2018-08-30 NOTE — Progress Notes (Signed)
Subjective:  Patient ID: Whitney [name], female    DOB: 06-Feb-1981  Age: 38 y.o. MRN: [id number]  CC: Establish Care (trouble sleeping- ruminating thoughts)  HPI  Related to stress at work and conflict with significant other. Denies any physical or verbal or sexual abuse Denies any use of ETOH or illicit drug use. Will like referral to psychobiologist. Depression screen [facility] 2/9 08/30/2018 08/30/2018  Decreased Interest 2 3  Down, Depressed, Hopeless 1 3  PHQ - 2 Score 3 6  Altered sleeping 3 -  Tired, decreased energy 2 -  Change in appetite 2 -  Feeling bad or failure about yourself  1 -  Trouble concentrating 3 -  Moving slowly or fidgety/restless 0 -  Suicidal thoughts 0 -  PHQ-9 Score 14 -   GAD 7 : Generalized Anxiety Score 08/30/2018  Nervous, Anxious, on Edge 1  Control/stop worrying 0  Worry too much - different things 2  Trouble relaxing 0  Restless 0  Easily annoyed or irritable 3  Afraid - awful might happen 1  Total GAD 7 Score 7   Reviewed past Medical, Social and Family history today.  Outpatient Medications Prior to Visit  Medication Sig Dispense Refill  . promethazine (PHENERGAN) 25 MG tablet Take 1 tablet (25 mg total) by mouth every 8 (eight) hours as needed for nausea or vomiting. 30 tablet 0  . SUMAtriptan (IMITREX) 50 MG tablet Take one tablet now.May repeat in 2 hours if headache persists or recurs. Do not take more than 200 mg per day 6 tablet 0   No facility-administered medications prior to visit.     ROS See HPI  Objective:  BP 100/76   Pulse 83   Temp 99.6 F (37.6 C) (Oral)   Ht 5' 6.75" (1.695 m)   Wt 234 lb 3.2 oz (106.2 kg)   SpO2 99%   BMI 36.96 kg/m   BP Readings from Last 3 Encounters:  08/30/18 100/76  04/09/16 116/89  03/03/15 101/57    Wt Readings from Last 3 Encounters:  08/30/18 234 lb 3.2 oz (106.2 kg)  04/09/16 223 lb (101.2 kg)   Physical Exam Psychiatric:        Attention and Perception: Attention normal.         Mood and Affect: Mood is anxious. Affect is angry.        Speech: Speech normal.        Behavior: Behavior is agitated. Behavior is cooperative.        Thought Content: Thought content normal. Thought content is not paranoid or delusional. Thought content does not include homicidal or suicidal ideation. Thought content does not include homicidal or suicidal plan.        Cognition and Memory: Cognition and memory normal.        Judgment: Judgment normal.    Lab Results  Component Value Date   WBC 5.4 04/09/2016   HGB 12.5 04/09/2016   HCT 37.3 04/09/2016   PLT 294.0 04/09/2016   GLUCOSE 102 (H) 04/09/2016   CHOL 168 04/09/2016   TRIG 66.0 04/09/2016   HDL 47.30 04/09/2016   LDLCALC 107 (H) 04/09/2016   ALT 15 04/09/2016   AST 15 04/09/2016   NA 139 04/09/2016   K 4.1 04/09/2016   CL 105 04/09/2016   CREATININE 0.79 04/09/2016   BUN 10 04/09/2016   CO2 28 04/09/2016   TSH 1.45 04/09/2016   HGBA1C 5.1 04/09/2016    Assessment & Plan:  Eleonora was seen today for establish care.  Diagnoses and all orders for this visit:  Adjustment disorder with mixed anxiety and depressed mood -     traZODone (DESYREL) 50 MG tablet; Take 0.5-1 tablets (25-50 mg total) by mouth at bedtime as needed for sleep. -     escitalopram (LEXAPRO) 10 MG tablet; Take 1 tablet (10 mg total) by mouth daily. -     Ambulatory referral to Psychology  Adjustment insomnia -     traZODone (DESYREL) 50 MG tablet; Take 0.5-1 tablets (25-50 mg total) by mouth at bedtime as needed for sleep. -     escitalopram (LEXAPRO) 10 MG tablet; Take 1 tablet (10 mg total) by mouth daily. -     Ambulatory referral to Psychology  Need for influenza vaccination -     Flu Vaccine QUAD 6+ mos PF IM (Fluarix Quad PF)   I am having Whitney [name] start on traZODone and escitalopram. I am also having her maintain her SUMAtriptan and promethazine.  Meds ordered this encounter  Medications  . traZODone (DESYREL) 50 MG  tablet    Sig: Take 0.5-1 tablets (25-50 mg total) by mouth at bedtime as needed for sleep.    Dispense:  20 tablet    Refill:  1    Order Specific Question:   Supervising Provider    Answer:   MATTHEWS, CODY [[id number]]  . escitalopram (LEXAPRO) 10 MG tablet    Sig: Take 1 tablet (10 mg total) by mouth daily.    Dispense:  30 tablet    Refill:  1    Order Specific Question:   Supervising Provider    Answer:   MATTHEWS, CODY [[id number]]    Problem List Items Addressed This Visit    None    Visit Diagnoses    Adjustment disorder with mixed anxiety and depressed mood    -  Primary   Relevant Medications   traZODone (DESYREL) 50 MG tablet   escitalopram (LEXAPRO) 10 MG tablet   Other Relevant Orders   Ambulatory referral to Psychology   Adjustment insomnia       Relevant Medications   traZODone (DESYREL) 50 MG tablet   escitalopram (LEXAPRO) 10 MG tablet   Other Relevant Orders   Ambulatory referral to Psychology   Need for influenza vaccination       Relevant Orders   Flu Vaccine QUAD 6+ mos PF IM (Fluarix Quad PF) (Completed)       Follow-up: Return in about 2 weeks (around 09/13/2018) for CPE(fasting) and anxiety f/up.  Wilfred Lacy, NP

## 2018-09-12 ENCOUNTER — Ambulatory Visit (INDEPENDENT_AMBULATORY_CARE_PROVIDER_SITE_OTHER): Payer: Commercial Managed Care - PPO | Admitting: Nurse Practitioner

## 2018-09-12 ENCOUNTER — Encounter: Payer: Self-pay | Admitting: Nurse Practitioner

## 2018-09-12 ENCOUNTER — Other Ambulatory Visit: Payer: Self-pay

## 2018-09-12 DIAGNOSIS — F329 Major depressive disorder, single episode, unspecified: Secondary | ICD-10-CM | POA: Insufficient documentation

## 2018-09-12 DIAGNOSIS — F4323 Adjustment disorder with mixed anxiety and depressed mood: Secondary | ICD-10-CM | POA: Diagnosis not present

## 2018-09-12 DIAGNOSIS — F5102 Adjustment insomnia: Secondary | ICD-10-CM

## 2018-09-12 DIAGNOSIS — F32A Depression, unspecified: Secondary | ICD-10-CM | POA: Insufficient documentation

## 2018-09-12 MED ORDER — ESCITALOPRAM OXALATE 10 MG PO TABS: 10.0000 mg | ORAL_TABLET | Freq: Every day | ORAL | 1 refills | Status: DC

## 2018-09-12 NOTE — Assessment & Plan Note (Addendum)
Continue lexapro. Advised about the importance of taking medication daily. Advised that medication can take 4-8weeks to become fully effective. Advised to minimize caffeine intake. F/up in 63month

## 2018-09-12 NOTE — Patient Instructions (Signed)
Stop trazodone. Continue lexapro  Minimize use of caffeine and ETOH. For sleep: Try Benadryl 25-50mg  at bedtime as needed or Melatonin 5-10mg  at bedtime as needed.   Insomnia Insomnia is a sleep disorder that makes it difficult to fall asleep or stay asleep. Insomnia can cause fatigue, low energy, difficulty concentrating, mood swings, and poor performance at work or school. There are three different ways to classify insomnia:  Difficulty falling asleep.  Difficulty staying asleep.  Waking up too early in the morning. Any type of insomnia can be long-term (chronic) or short-term (acute). Both are common. Short-term insomnia usually lasts for three months or less. Chronic insomnia occurs at least three times a week for longer than three months. What are the causes? Insomnia may be caused by another condition, situation, or substance, such as:  Anxiety.  Certain medicines.  Gastroesophageal reflux disease (GERD) or other gastrointestinal conditions.  Asthma or other breathing conditions.  Restless legs syndrome, sleep apnea, or other sleep disorders.  Chronic pain.  Menopause.  Stroke.  Abuse of alcohol, tobacco, or illegal drugs.  Mental health conditions, such as depression.  Caffeine.  Neurological disorders, such as Alzheimer's disease.  An overactive thyroid (hyperthyroidism). Sometimes, the cause of insomnia may not be known. What increases the risk? Risk factors for insomnia include:  Gender. Women are affected more often than men.  Age. Insomnia is more common as you get older.  Stress.  Lack of exercise.  Irregular work schedule or working night shifts.  Traveling between different time zones.  Certain medical and mental health conditions. What are the signs or symptoms? If you have insomnia, the main symptom is having trouble falling asleep or having trouble staying asleep. This may lead to other symptoms, such as:  Feeling fatigued or having  low energy.  Feeling nervous about going to sleep.  Not feeling rested in the morning.  Having trouble concentrating.  Feeling irritable, anxious, or depressed. How is this diagnosed? This condition may be diagnosed based on:  Your symptoms and medical history. Your health care provider may ask about: ? Your sleep habits. ? Any medical conditions you have. ? Your mental health.  A physical exam. How is this treated? Treatment for insomnia depends on the cause. Treatment may focus on treating an underlying condition that is causing insomnia. Treatment may also include:  Medicines to help you sleep.  Counseling or therapy.  Lifestyle adjustments to help you sleep better. Follow these instructions at home: Eating and drinking   Limit or avoid alcohol, caffeinated beverages, and cigarettes, especially close to bedtime. These can disrupt your sleep.  Do not eat a large meal or eat spicy foods right before bedtime. This can lead to digestive discomfort that can make it hard for you to sleep. Sleep habits   Keep a sleep diary to help you and your health care provider figure out what could be causing your insomnia. Write down: ? When you sleep. ? When you wake up during the night. ? How well you sleep. ? How rested you feel the next day. ? Any side effects of medicines you are taking. ? What you eat and drink.  Make your bedroom a dark, comfortable place where it is easy to fall asleep. ? Put up shades or blackout curtains to block light from outside. ? Use a white noise machine to block noise. ? Keep the temperature cool.  Limit screen use before bedtime. This includes: ? Watching TV. ? Using your smartphone, tablet, or computer.  Stick to a routine that includes going to bed and waking up at the same times every day and night. This can help you fall asleep faster. Consider making a quiet activity, such as reading, part of your nighttime routine.  Try to avoid taking  naps during the day so that you sleep better at night.  Get out of bed if you are still awake after 15 minutes of trying to sleep. Keep the lights down, but try reading or doing a quiet activity. When you feel sleepy, go back to bed. General instructions  Take over-the-counter and prescription medicines only as told by your health care provider.  Exercise regularly, as told by your health care provider. Avoid exercise starting several hours before bedtime.  Use relaxation techniques to manage stress. Ask your health care provider to suggest some techniques that may work well for you. These may include: ? Breathing exercises. ? Routines to release muscle tension. ? Visualizing peaceful scenes.  Make sure that you drive carefully. Avoid driving if you feel very sleepy.  Keep all follow-up visits as told by your health care provider. This is important. Contact a health care provider if:  You are tired throughout the day.  You have trouble in your daily routine due to sleepiness.  You continue to have sleep problems, or your sleep problems get worse. Get help right away if:  You have serious thoughts about hurting yourself or someone else. If you ever feel like you may hurt yourself or others, or have thoughts about taking your own life, get help right away. You can go to your nearest emergency department or call:  Your local emergency services (911 in the [place]  A suicide crisis helpline, such as the [facility] at 272-781-3544. This is open 24 hours a day. Summary  Insomnia is a sleep disorder that makes it difficult to fall asleep or stay asleep.  Insomnia can be long-term (chronic) or short-term (acute).  Treatment for insomnia depends on the cause. Treatment may focus on treating an underlying condition that is causing insomnia.  Keep a sleep diary to help you and your health care provider figure out what could be causing your insomnia. This  information is not intended to replace advice given to you by your health care provider. Make sure you discuss any questions you have with your health care provider. Document Released: 05/29/2000 Document Revised: 03/11/2017 Document Reviewed: 03/11/2017 Elsevier Interactive Patient Education  2019 [employer].

## 2018-09-12 NOTE — Assessment & Plan Note (Signed)
Advised to stop trazodone. Try benadryl or melatonin for sleep.  F/up in 30month Consider use of restoril if no improvement with benadryl or melatonin

## 2018-09-12 NOTE — Progress Notes (Signed)
Virtual Visit via Video Note  I connected with Whitney [name] on 09/12/18 at  9:15 AM EDT by a video enabled telemedicine application and verified that I am speaking with the correct person using two identifiers.   I discussed the limitations of evaluation and management by telemedicine and the availability of in person appointments. The patient expressed understanding and agreed to proceed.  History of Present Illness: CC: follow up for anxiety--med help but trazodone is not helping/ FYI--not make an appt with the psychiatrist yet   Current use of lexapro and trazodone x 2weeks. Reports lexapro has helped to decrease irritability, but admits to skipping medication on some days.. Trazodone has no effect on sleep at 25 and 50mg . She denies any adverse effects with medications.  Insomnia  Primary symptoms: fragmented sleep, sleep disturbance, difficulty falling asleep, frequent awakening, premature morning awakening, malaise/fatigue.  The current episode started more than one year. The onset quality is gradual. The problem occurs nightly. The problem is unchanged. The symptoms are aggravated by anxiety, bed partner, caffeine, emotional upset and work stress. How many beverages per day that contain caffeine: 4-5.  Types of beverages you drink: energy drink and soda. Past treatments include medication. The treatment provided no relief. Typical bedtime:  10-11 P.M..  How long after going to bed to you fall asleep: over an hour.   PMH includes: no hypertension, depression, family stress or anxiety, no restless leg syndrome, work related stressors, no chronic pain. Prior diagnostic workup includes:  No prior workup.   Reviewed medication and problem list with patient.  Observations/Objective: Unable to provide any BP reading.  Physical Exam  Constitutional: She is oriented to person, place, and time. She appears well-developed.  Neck: Normal range of motion.  Respiratory: Effort normal.   Neurological: She is alert and oriented to person, place, and time.  Psychiatric: She has a normal mood and affect. Her behavior is normal. Thought content normal.   Assessment and Plan: Whitney [name] was seen today for follow-up.  Diagnoses and all orders for this visit:  Adjustment disorder with mixed anxiety and depressed mood -     escitalopram (LEXAPRO) 10 MG tablet; Take 1 tablet (10 mg total) by mouth daily.  Adjustment insomnia -     escitalopram (LEXAPRO) 10 MG tablet; Take 1 tablet (10 mg total) by mouth daily.   Follow Up Instructions: Stop trazodone. Continue lexapro  Minimize use of caffeine and ETOH. For sleep: Try Benadryl 25-50mg  at bedtime as needed or Melatonin 5-10mg  at bedtime as needed.  I discussed the assessment and treatment plan with the patient. The patient was provided an opportunity to ask questions and all were answered. The patient agreed with the plan and demonstrated an understanding of the instructions.   The patient was advised to call back or seek an in-person evaluation if the symptoms worsen or if the condition fails to improve as anticipated.  I provided 20 minutes of non-face-to-face time during this encounter.   Wilfred Lacy, NP

## 2018-09-14 ENCOUNTER — Encounter: Payer: Self-pay | Admitting: Nurse Practitioner

## 2018-10-13 ENCOUNTER — Ambulatory Visit (INDEPENDENT_AMBULATORY_CARE_PROVIDER_SITE_OTHER): Payer: Commercial Managed Care - PPO | Admitting: Nurse Practitioner

## 2018-10-13 ENCOUNTER — Encounter: Payer: Self-pay | Admitting: Nurse Practitioner

## 2018-10-13 VITALS — Temp 98.0°F | Ht 66.75 in

## 2018-10-13 DIAGNOSIS — F5102 Adjustment insomnia: Secondary | ICD-10-CM

## 2018-10-13 DIAGNOSIS — N898 Other specified noninflammatory disorders of vagina: Secondary | ICD-10-CM | POA: Diagnosis not present

## 2018-10-13 DIAGNOSIS — N921 Excessive and frequent menstruation with irregular cycle: Secondary | ICD-10-CM | POA: Diagnosis not present

## 2018-10-13 DIAGNOSIS — F4323 Adjustment disorder with mixed anxiety and depressed mood: Secondary | ICD-10-CM | POA: Diagnosis not present

## 2018-10-13 MED ORDER — FLUCONAZOLE 150 MG PO TABS: 150.0000 mg | ORAL_TABLET | Freq: Once | ORAL | 0 refills | Status: AC

## 2018-10-13 MED ORDER — METRONIDAZOLE 0.75 % VA GEL: 1.0000 | Freq: Every day | VAGINAL | 0 refills | Status: DC

## 2018-10-13 NOTE — Assessment & Plan Note (Signed)
mproving mood with lexapro Improved sleep Has eliminated caffeine. Waiting for appt with psychology

## 2018-10-13 NOTE — Progress Notes (Signed)
Virtual Visit via Video Note  I connected with Whitney [name] on 10/13/18 at  9:15 AM EDT by a video enabled telemedicine application and verified that I am speaking with the correct person using two identifiers.  Location: Patient: Home Provider: Office   I discussed the limitations of evaluation and management by telemedicine and the availability of in person appointments. The patient expressed understanding and agreed to proceed.   CC: follow up on anxiety--med is ok, sleeping issue consult--pt stated she use restroom at night alot and frbroids feels like getting larger that migh cause frequent uriant--tried tranzodone and melatoni didnt help/ has GYN. BV consunt--going on wks--heppen when use regular soap--notice pices of tissue like discharg when use restroom. FYI--will have vital sigh during visit  History of Present Illness:  Anxiety and Depression: Reports improved mood and sleep with lexapro. Has not needed melatonin or bendryl. Depression screen [facility] 2/9 08/30/2018 08/30/2018  Decreased Interest 2 3  Down, Depressed, Hopeless 1 3  PHQ - 2 Score 3 6  Altered sleeping 3 -  Tired, decreased energy 2 -  Change in appetite 2 -  Feeling bad or failure about yourself  1 -  Trouble concentrating 3 -  Moving slowly or fidgety/restless 0 -  Suicidal thoughts 0 -  PHQ-9 Score 14 -   GAD 7 : Generalized Anxiety Score 08/30/2018  Nervous, Anxious, on Edge 1  Control/stop worrying 0  Worry too much - different things 2  Trouble relaxing 0  Restless 0  Easily annoyed or irritable 3  Afraid - awful might happen 1  Total GAD 7 Score 7   Vaginal Discharge  The patient's primary symptoms include a genital odor and vaginal discharge. The patient's pertinent negatives include no genital rash, pelvic pain or vaginal bleeding. This is a new problem. The current episode started in the past 7 days. The problem has been unchanged. She is not pregnant. Associated symptoms include frequency.  Pertinent negatives include no abdominal pain, discolored urine, dysuria, fever, flank pain, hematuria or urgency. The vaginal discharge was thick and yellow. The vaginal bleeding is typical of menses. She has not been passing clots. She has not been passing tissue. Exacerbated by: use of scented soap. She has tried nothing for the symptoms. She is sexually active. No, her partner does not have an STD. Contraceptive use: nexplanon. Her menstrual history has been irregular. Her past medical history is significant for metrorrhagia and vaginosis. There is no history of PID or an STD. (Hx of fibroids)   Observations/Objective: Physical Exam  Constitutional: She is oriented to person, place, and time. No distress.  Pulmonary/Chest: Effort normal.  Neurological: She is alert and oriented to person, place, and time.  Psychiatric: She has a normal mood and affect. Her behavior is normal. Judgment and thought content normal.   Assessment and Plan: Whitney [name] was seen today for follow-up.  Diagnoses and all orders for this visit:  Adjustment disorder with mixed anxiety and depressed mood  Adjustment insomnia  Vaginal discharge -     metroNIDAZOLE (METROGEL VAGINAL) 0.75 % vaginal gel; Place 1 Applicatorful vaginally at bedtime. -     fluconazole (DIFLUCAN) 150 MG tablet; Take 1 tablet (150 mg total) by mouth once for 1 dose.  Menorrhagia with irregular cycle   Follow Up Instructions: Make appt with GYN ASAP. Continue current medications. F/up in 68months  I discussed the assessment and treatment plan with the patient. The patient was provided an opportunity to ask questions and all  were answered. The patient agreed with the plan and demonstrated an understanding of the instructions.   The patient was advised to call back or seek an in-person evaluation if the symptoms worsen or if the condition fails to improve as anticipated.   Wilfred Lacy, NP

## 2018-10-13 NOTE — Assessment & Plan Note (Addendum)
Reports hx of uterine fibroid. nexplanon inserted 69yrs ago She thinks fibroid has increased in size. Experiencing urinary frequency as a result. No dysuria, no flank pain, no ABD pain. Treated for vaginitis today Advised to make appt with GYN asap for re eval of fibroid.

## 2018-10-13 NOTE — Assessment & Plan Note (Signed)
Improving mood with lexapro Improved sleep Has eliminated caffeine. Waiting for appt with psychology

## 2018-10-13 NOTE — Patient Instructions (Signed)
Make appt with GYN ASAP. Continue current medications. F/up in 37months

## 2018-10-28 ENCOUNTER — Ambulatory Visit (INDEPENDENT_AMBULATORY_CARE_PROVIDER_SITE_OTHER): Payer: Commercial Managed Care - PPO | Admitting: Psychology

## 2018-10-28 DIAGNOSIS — F419 Anxiety disorder, unspecified: Secondary | ICD-10-CM | POA: Diagnosis not present

## 2018-10-28 DIAGNOSIS — F3340 Major depressive disorder, recurrent, in remission, unspecified: Secondary | ICD-10-CM

## 2018-11-22 ENCOUNTER — Ambulatory Visit (INDEPENDENT_AMBULATORY_CARE_PROVIDER_SITE_OTHER): Payer: Commercial Managed Care - PPO | Admitting: Psychology

## 2018-11-22 DIAGNOSIS — F3340 Major depressive disorder, recurrent, in remission, unspecified: Secondary | ICD-10-CM | POA: Diagnosis not present

## 2018-11-22 DIAGNOSIS — F419 Anxiety disorder, unspecified: Secondary | ICD-10-CM | POA: Diagnosis not present

## 2018-12-05 ENCOUNTER — Ambulatory Visit (INDEPENDENT_AMBULATORY_CARE_PROVIDER_SITE_OTHER): Payer: Commercial Managed Care - PPO | Admitting: Psychology

## 2018-12-05 DIAGNOSIS — F3340 Major depressive disorder, recurrent, in remission, unspecified: Secondary | ICD-10-CM | POA: Diagnosis not present

## 2018-12-05 DIAGNOSIS — F419 Anxiety disorder, unspecified: Secondary | ICD-10-CM

## 2018-12-06 ENCOUNTER — Telehealth: Payer: Self-pay | Admitting: Nurse Practitioner

## 2018-12-06 NOTE — Telephone Encounter (Signed)
I called and left message on patient voicemail to call office and reschedule appointment for 01/05/2019 with Wilfred Lacy, provider will not be in office.

## 2018-12-13 ENCOUNTER — Other Ambulatory Visit: Payer: Self-pay | Admitting: Obstetrics and Gynecology

## 2018-12-19 ENCOUNTER — Ambulatory Visit: Payer: Self-pay | Admitting: Psychology

## 2018-12-23 NOTE — Pre-Procedure Instructions (Signed)
S[facility] [place] Fus  12/23/2018      [id number] - [place], [place] [address] Phone: [phone] Fax: [phone]    Your procedure is scheduled on December 29, 2018.  Report to [facility] Entrance "A" at 1130 AM.  Call this number if you have problems the morning of surgery:  [phone]   Remember:  Do not eat after midnight.  You may drink clear liquids until 1030 AM.  Clear liquids allowed are:      Water, Juice (non-citric and without pulp), Clear Tea, Black Coffee only and Gatorade    Take these medicines the morning of surgery with A SIP OF WATER  Valacyclorvir (Valtrex)-if needed  Beginning now, STOP taking any Aspirin (unless otherwise instructed by your surgeon), Aleve, Naproxen, Ibuprofen, Motrin, Advil, Goody's, BC's, all herbal medications, fish oil, and all vitamins    Do not wear jewelry, make-up or nail polish.  Do not wear lotions, powders, or perfumes, or deodorant.  Do not shave 48 hours prior to surgery.    Do not bring valuables to the hospital.  [facility] is not responsible for any belongings or valuables.  Contacts, dentures or bridgework may not be worn into surgery.  Leave your suitcase in the car.  After surgery it may be brought to your room.  For patients admitted to the hospital, discharge time will be determined by your treatment team.  Patients discharged the day of surgery will not be allowed to drive home.    [place]- Preparing For Surgery  Before surgery, you can play an important role. Because skin is not sterile, your skin needs to be as free of germs as possible. You can reduce the number of germs on your skin by washing with CHG (chlorahexidine gluconate) Soap before surgery.  CHG is an antiseptic cleaner which kills germs and bonds with the skin to continue killing germs even after washing.    Oral Hygiene is also important to reduce your risk of infection.  Remember - BRUSH YOUR  TEETH THE MORNING OF SURGERY WITH YOUR REGULAR TOOTHPASTE  Please do not use if you have an allergy to CHG or antibacterial soaps. If your skin becomes reddened/irritated stop using the CHG.  Do not shave (including legs and underarms) for at least 48 hours prior to first CHG shower. It is OK to shave your face.  Please follow these instructions carefully.   1. Shower the NIGHT BEFORE SURGERY and the MORNING OF SURGERY with CHG.   2. If you chose to wash your hair, wash your hair first as usual with your normal shampoo.  3. After you shampoo, rinse your hair and body thoroughly to remove the shampoo.  4. Use CHG as you would any other liquid soap. You can apply CHG directly to the skin and wash gently with a scrungie or a clean washcloth.   5. Apply the CHG Soap to your body ONLY FROM THE NECK DOWN.  Do not use on open wounds or open sores. Avoid contact with your eyes, ears, mouth and genitals (private parts). Wash Face and genitals (private parts)  with your normal soap.  6. Wash thoroughly, paying special attention to the area where your surgery will be performed.  7. Thoroughly rinse your body with warm water from the neck down.  8. DO NOT shower/wash with your normal soap after using and rinsing off the CHG Soap.  9. Pat yourself dry with a CLEAN  TOWEL.  10. Wear CLEAN PAJAMAS to bed the night before surgery, wear comfortable clothes the morning of surgery  11. Place CLEAN SHEETS on your bed the night of your first shower and DO NOT SLEEP WITH PETS.  Day of Surgery: Shower as above Do not apply any deodorants/lotions.  Please wear clean clothes to the hospital/surgery center.   Remember to brush your teeth WITH YOUR REGULAR TOOTHPASTE.   Please read over the following fact sheets that you were given.

## 2018-12-26 ENCOUNTER — Encounter (HOSPITAL_COMMUNITY): Payer: Self-pay

## 2018-12-26 ENCOUNTER — Other Ambulatory Visit (HOSPITAL_COMMUNITY)
Admission: RE | Admit: 2018-12-26 | Discharge: 2018-12-26 | Disposition: A | Discharge status: Home / Self Care | Payer: Commercial Managed Care - PPO | Source: Ambulatory Visit | Attending: Obstetrics and Gynecology | Admitting: Obstetrics and Gynecology

## 2018-12-26 ENCOUNTER — Other Ambulatory Visit: Payer: Self-pay

## 2018-12-26 ENCOUNTER — Encounter (HOSPITAL_COMMUNITY)
Admission: RE | Admit: 2018-12-26 | Discharge: 2018-12-26 | Disposition: A | Discharge status: Home / Self Care | Payer: Commercial Managed Care - PPO | Source: Ambulatory Visit | Attending: Obstetrics and Gynecology | Admitting: Obstetrics and Gynecology

## 2018-12-26 DIAGNOSIS — Z01818 Encounter for other preprocedural examination: Secondary | ICD-10-CM | POA: Insufficient documentation

## 2018-12-26 DIAGNOSIS — Z1159 Encounter for screening for other viral diseases: Secondary | ICD-10-CM | POA: Insufficient documentation

## 2018-12-26 LAB — CBC
HCT: 39 % (ref 36.0–46.0)
Hemoglobin: 12.5 g/dL (ref 12.0–15.0)
MCH: 30.3 pg (ref 26.0–34.0)
MCHC: 32.1 g/dL (ref 30.0–36.0)
MCV: 94.7 fL (ref 80.0–100.0)
Platelets: 311 10*3/uL (ref 150–400)
RBC: 4.12 MIL/uL (ref 3.87–5.11)
RDW: 12.7 % (ref 11.5–15.5)
WBC: 5.4 10*3/uL (ref 4.0–10.5)
nRBC: 0 % (ref 0.0–0.2)

## 2018-12-26 LAB — BASIC METABOLIC PANEL
Anion gap: 9 (ref 5–15)
BUN: 12 mg/dL (ref 6–20)
CO2: 26 mmol/L (ref 22–32)
Calcium: 8.8 mg/dL — ABNORMAL LOW (ref 8.9–10.3)
Chloride: 104 mmol/L (ref 98–111)
Creatinine, Ser: 0.86 mg/dL (ref 0.44–1.00)
GFR calc Af Amer: >60 mL/min (ref >60)
GFR calc non Af Amer: >60 mL/min (ref >60)
Glucose, Bld: 93 mg/dL (ref 70–99)
Potassium: 3.7 mmol/L (ref 3.5–5.1)
Sodium: 139 mmol/L (ref 135–145)

## 2018-12-26 LAB — TYPE AND SCREEN
ABO/RH(D): A POS
Antibody Screen: NEGATIVE

## 2018-12-26 NOTE — Progress Notes (Addendum)
  Coronavirus Screening COVID test scheduled today at [facility] Have you experienced the following symptoms:  Cough yes/no: No Fever (>100.67F)  yes/no: No Runny nose yes/no: No Sore throat yes/no: No Difficulty breathing/shortness of breath  yes/no: No Have you or a family member traveled in the last 14 days and where? yes/no: No  PCP - Dr. Lorayne Marek, [facility]  Cardiologist - denies  Chest x-ray - NA  EKG - Today  Stress Test - denies  ECHO - denies  Cardiac Cath - denies  AICD-denies PM-denies LOOP-denies  Sleep Study - denies CPAP - NA  LABS-CBC,BMP,T/S  ASA-denies  ERAS-clear liquids only  HA1C-denies Fasting Blood Sugar - NA Checks Blood Sugar __0___ times a day  Anesthesia-Y. Review EKG.(Pt gave h/o murmur during childhood which appears to have resolved per pt and her PCP)  Pt denies having chest pain, sob, or fever at this time. All instructions explained to the pt, with a verbal understanding of the material. Pt agrees to go over the instructions while at home for a better understanding. Pt also instructed to self quarantine after being tested for COVID-19. The opportunity to ask questions was provided.

## 2018-12-26 NOTE — Progress Notes (Signed)
Anesthesia Chart Review:  Case: [id number] Date/Time: 12/29/18 1315   Procedure: HYSTERECTOMY ABDOMINAL WITH SALPINGECTOMY (Bilateral )   Anesthesia type: General   Pre-op diagnosis: Symptomatic Uterine Fibroids   Location: [facility] OR ROOM 07 / [place] [facility]   Surgeon: Brien Few, MD      DISCUSSION: Patient is a 38 year old female scheduled for the above procedure.  History includes never smoker, asthma. BMI is consistent with obesity. Reportedly had a childhood ("age 21") murmur that resolved (denied history of echo; several notes reviewed in Epic and Care Everywhere from 2014-09/2018 that do not mention murmur on physical exam).  Presurgical COVID test on 12/26/18.   LMP 12/09/18. She has a Nexplanon implant, inserted 02/2017.   Case moved to [place] from [place]. Anesthesiologist to evaluate on the day of surgery.    VS: BP 138/79   Pulse 68   Temp (!) 36.3 C   Resp 20   Ht 5\' 7"  (1.702 m)   Wt 105.9 kg   LMP 12/09/2018   SpO2 99%   BMI 36.56 kg/m     PROVIDERS: Nche, Charlene Brooke, NP is PCP. Last seen via virtual visit 10/13/18.    LABS: Labs reviewed: Acceptable for surgery. (all labs ordered are listed, but only abnormal results are displayed)  Labs Reviewed  BASIC METABOLIC PANEL - Abnormal; Notable for the following components:      Result Value   Calcium 8.8 (*)    All other components within normal limits  CBC  TYPE AND SCREEN  ABO/RH    EKG: 12/26/18: Normal sinus rhythm Minimal voltage criteria for LVH, may be normal variant Borderline ECG   CV: N/A   Past Medical History:  Diagnosis Date  . Asthma   . Headache   . Heart murmur    was diagnosed at 1 yrs of age,but not found in adulthood  per pts PCP.  Marland Kitchen History of shingles   . UTI (lower urinary tract infection)     Past Surgical History:  Procedure Laterality Date  . BUNIONECTOMY Right    2015    MEDICATIONS: . escitalopram (LEXAPRO) 10 MG tablet  . metroNIDAZOLE (METROGEL  VAGINAL) 0.75 % vaginal gel  . Multiple Vitamin (MULTIVITAMINS PO)  . valACYclovir (VALTREX) 1000 MG tablet   No current facility-administered medications for this encounter.     Myra Gianotti, PA-C Surgical Short Stay/Anesthesiology [facility] Phone [phone] [facility] Phone [phone] 12/26/2018 5:44 PM

## 2018-12-26 NOTE — Anesthesia Preprocedure Evaluation (Addendum)
Anesthesia Evaluation  Patient identified by MRN, date of birth, ID band Patient awake    Reviewed: Allergy & Precautions, H&P , NPO status , Patient's Chart, lab work & pertinent test results  History of Anesthesia Complications (+) PONV  Airway Mallampati: II  TM Distance: >3 FB Neck ROM: Full    Dental no notable dental hx. (+) Teeth Intact   Pulmonary neg pulmonary ROS, asthma ,    Pulmonary exam normal breath sounds clear to auscultation       Cardiovascular Exercise Tolerance: Good negative cardio ROS Normal cardiovascular exam Rhythm:Regular Rate:Normal     Neuro/Psych  Headaches, PSYCHIATRIC DISORDERS negative neurological ROS  negative psych ROS   GI/Hepatic negative GI ROS, Neg liver ROS,   Endo/Other  negative endocrine ROSMorbid obesity  Renal/GU negative Renal ROS  negative genitourinary   Musculoskeletal negative musculoskeletal ROS (+)   Abdominal   Peds negative pediatric ROS (+)  Hematology negative hematology ROS (+)   Anesthesia Other Findings   Reproductive/Obstetrics negative OB ROS                            Anesthesia Physical Anesthesia Plan  ASA: III  Anesthesia Plan: General   Post-op Pain Management:    Induction:   PONV Risk Score and Plan: 3 and Ondansetron, Treatment may vary due to age or medical condition, Dexamethasone and Scopolamine patch - Pre-op  Airway Management Planned: Oral ETT and LMA  Additional Equipment:   Intra-op Plan:   Post-operative Plan:   Informed Consent: I have reviewed the patients History and Physical, chart, labs and discussed the procedure including the risks, benefits and alternatives for the proposed anesthesia with the patient or authorized representative who has indicated his/her understanding and acceptance.       Plan Discussed with: CRNA, Anesthesiologist and Surgeon  Anesthesia Plan Comments: (PAT  note written 12/26/2018 by Myra Gianotti, PA-C. )       Anesthesia Quick Evaluation

## 2018-12-27 LAB — ABO/RH: ABO/RH(D): A POS

## 2018-12-27 LAB — SARS CORONAVIRUS 2 (TAT 6-24 HRS): SARS Coronavirus 2: NEGATIVE

## 2018-12-28 ENCOUNTER — Other Ambulatory Visit: Payer: Self-pay | Admitting: Obstetrics and Gynecology

## 2018-12-28 NOTE — Progress Notes (Addendum)
Pt had PAT appt 12-26-2018 @ [place] PST.  Case has been moved to [facility].  Called and spoke w/ pt via phone with instructions for dos @ [facility].  Pt verbalized understanding to be npo after mn w/ exception clear liquids until 1030 (pt has list from PAT visit at [facility]) then nothing by mouth.   Lab results dated 12-26-2018 in epic and ekg.   Pt will need repeat T&S for [facility] campus to meet blood bank guidelines and will need urine preg.   Pt aware will stay at [facility] main hospital after surgery.  Pt will do CHG shower tonight and am dos.  Called and spoke w/ shanelle , or scheduler for dr Ronita Hipps,  requested order for T&S and admit order changed to [facility] hospital.

## 2018-12-29 ENCOUNTER — Other Ambulatory Visit: Payer: Self-pay

## 2018-12-29 ENCOUNTER — Encounter (HOSPITAL_COMMUNITY)
Admission: RE | Disposition: A | Discharge status: Home / Self Care | Payer: Self-pay | Source: Home / Self Care | Attending: Obstetrics and Gynecology

## 2018-12-29 ENCOUNTER — Encounter (HOSPITAL_BASED_OUTPATIENT_CLINIC_OR_DEPARTMENT_OTHER): Payer: Self-pay | Admitting: Anesthesiology

## 2018-12-29 ENCOUNTER — Ambulatory Visit (HOSPITAL_BASED_OUTPATIENT_CLINIC_OR_DEPARTMENT_OTHER): Payer: Commercial Managed Care - PPO | Admitting: Anesthesiology

## 2018-12-29 ENCOUNTER — Ambulatory Visit (HOSPITAL_BASED_OUTPATIENT_CLINIC_OR_DEPARTMENT_OTHER): Payer: Commercial Managed Care - PPO | Admitting: Vascular Surgery

## 2018-12-29 ENCOUNTER — Inpatient Hospital Stay (HOSPITAL_BASED_OUTPATIENT_CLINIC_OR_DEPARTMENT_OTHER)
Admission: RE | Admit: 2018-12-29 | Discharge: 2018-12-31 | DRG: 743 | Disposition: A | Discharge status: Home / Self Care | Payer: Commercial Managed Care - PPO | Attending: Obstetrics and Gynecology | Admitting: Obstetrics and Gynecology

## 2018-12-29 DIAGNOSIS — N83292 Other ovarian cyst, left side: Secondary | ICD-10-CM | POA: Diagnosis present

## 2018-12-29 DIAGNOSIS — N736 Female pelvic peritoneal adhesions (postinfective): Secondary | ICD-10-CM | POA: Diagnosis present

## 2018-12-29 DIAGNOSIS — N920 Excessive and frequent menstruation with regular cycle: Secondary | ICD-10-CM | POA: Diagnosis present

## 2018-12-29 DIAGNOSIS — Z6836 Body mass index (BMI) 36.0-36.9, adult: Secondary | ICD-10-CM

## 2018-12-29 DIAGNOSIS — Z1159 Encounter for screening for other viral diseases: Secondary | ICD-10-CM | POA: Diagnosis not present

## 2018-12-29 DIAGNOSIS — D259 Leiomyoma of uterus, unspecified: Secondary | ICD-10-CM | POA: Diagnosis present

## 2018-12-29 DIAGNOSIS — D219 Benign neoplasm of connective and other soft tissue, unspecified: Secondary | ICD-10-CM | POA: Diagnosis present

## 2018-12-29 HISTORY — PX: HYSTERECTOMY ABDOMINAL WITH SALPINGECTOMY: SHX6725

## 2018-12-29 LAB — ABO/RH: ABO/RH(D): A POS

## 2018-12-29 LAB — TYPE AND SCREEN
ABO/RH(D): A POS
Antibody Screen: NEGATIVE

## 2018-12-29 LAB — POCT PREGNANCY, URINE: Preg Test, Ur: NEGATIVE

## 2018-12-29 SURGERY — HYSTERECTOMY, TOTAL, ABDOMINAL, WITH SALPINGECTOMY
Anesthesia: General | Site: Abdomen | Laterality: Bilateral

## 2018-12-29 MED ORDER — SODIUM CHLORIDE 0.9% FLUSH: 9.0000 mL | INTRAVENOUS | Status: DC | PRN

## 2018-12-29 MED ORDER — KETOROLAC TROMETHAMINE 30 MG/ML IJ SOLN
INTRAMUSCULAR | Status: DC | PRN
  Administered 2018-12-29: 30 mg via INTRAVENOUS

## 2018-12-29 MED ORDER — MIDAZOLAM HCL 2 MG/2ML IJ SOLN
INTRAMUSCULAR | Status: AC
  Filled 2018-12-29: qty 2

## 2018-12-29 MED ORDER — NALOXONE HCL 0.4 MG/ML IJ SOLN: 0.4000 mg | INTRAMUSCULAR | Status: DC | PRN

## 2018-12-29 MED ORDER — KETOROLAC TROMETHAMINE 30 MG/ML IJ SOLN
INTRAMUSCULAR | Status: AC
  Filled 2018-12-29: qty 1

## 2018-12-29 MED ORDER — ONDANSETRON HCL 4 MG/2ML IJ SOLN
INTRAMUSCULAR | Status: AC
  Filled 2018-12-29: qty 2

## 2018-12-29 MED ORDER — HYDROMORPHONE 1 MG/ML IV SOLN
INTRAVENOUS | Status: DC
  Administered 2018-12-29: 2.7 mg via INTRAVENOUS
  Administered 2018-12-29: 30 mg via INTRAVENOUS
  Administered 2018-12-29: 0.7 mg via INTRAVENOUS
  Administered 2018-12-30: 0 mg via INTRAVENOUS
  Filled 2018-12-29: qty 30

## 2018-12-29 MED ORDER — ESCITALOPRAM OXALATE 10 MG PO TABS: 10.0000 mg | ORAL_TABLET | Freq: Every day | ORAL | Status: DC

## 2018-12-29 MED ORDER — MIDAZOLAM HCL 5 MG/5ML IJ SOLN
INTRAMUSCULAR | Status: DC | PRN
  Administered 2018-12-29: 2 mg via INTRAVENOUS

## 2018-12-29 MED ORDER — CEFAZOLIN SODIUM-DEXTROSE 2-4 GM/100ML-% IV SOLN
2.0000 g | INTRAVENOUS | Status: AC
  Administered 2018-12-29: 2 g via INTRAVENOUS
  Filled 2018-12-29: qty 100

## 2018-12-29 MED ORDER — FENTANYL CITRATE (PF) 250 MCG/5ML IJ SOLN
INTRAMUSCULAR | Status: AC
  Filled 2018-12-29: qty 5

## 2018-12-29 MED ORDER — FENTANYL CITRATE (PF) 100 MCG/2ML IJ SOLN
INTRAMUSCULAR | Status: AC
  Filled 2018-12-29: qty 2

## 2018-12-29 MED ORDER — DIPHENHYDRAMINE HCL 50 MG/ML IJ SOLN: 12.5000 mg | Freq: Four times a day (QID) | INTRAMUSCULAR | Status: DC | PRN

## 2018-12-29 MED ORDER — LACTATED RINGERS IV SOLN
INTRAVENOUS | Status: DC
  Administered 2018-12-29 (×2): via INTRAVENOUS
  Filled 2018-12-29: qty 1000

## 2018-12-29 MED ORDER — ONDANSETRON HCL 4 MG/2ML IJ SOLN
4.0000 mg | Freq: Once | INTRAMUSCULAR | Status: DC | PRN
  Filled 2018-12-29: qty 2

## 2018-12-29 MED ORDER — FENTANYL CITRATE (PF) 100 MCG/2ML IJ SOLN
25.0000 ug | INTRAMUSCULAR | Status: DC | PRN
  Administered 2018-12-29 (×3): 50 ug via INTRAVENOUS
  Filled 2018-12-29: qty 1

## 2018-12-29 MED ORDER — PROPOFOL 10 MG/ML IV BOLUS
INTRAVENOUS | Status: AC
  Filled 2018-12-29: qty 20

## 2018-12-29 MED ORDER — LIDOCAINE 2% (20 MG/ML) 5 ML SYRINGE
INTRAMUSCULAR | Status: DC | PRN
  Administered 2018-12-29: 100 mg via INTRAVENOUS

## 2018-12-29 MED ORDER — ACETAMINOPHEN 325 MG PO TABS
325.0000 mg | ORAL_TABLET | ORAL | Status: DC | PRN
  Filled 2018-12-29: qty 2

## 2018-12-29 MED ORDER — DEXAMETHASONE SODIUM PHOSPHATE 10 MG/ML IJ SOLN
INTRAMUSCULAR | Status: DC | PRN
  Administered 2018-12-29: 10 mg via INTRAVENOUS

## 2018-12-29 MED ORDER — FENTANYL CITRATE (PF) 100 MCG/2ML IJ SOLN
INTRAMUSCULAR | Status: DC | PRN
  Administered 2018-12-29: 100 ug via INTRAVENOUS
  Administered 2018-12-29 (×3): 50 ug via INTRAVENOUS

## 2018-12-29 MED ORDER — DIPHENHYDRAMINE HCL 12.5 MG/5ML PO ELIX: 12.5000 mg | ORAL_SOLUTION | Freq: Four times a day (QID) | ORAL | Status: DC | PRN

## 2018-12-29 MED ORDER — OXYCODONE HCL 5 MG PO TABS
5.0000 mg | ORAL_TABLET | Freq: Once | ORAL | Status: DC | PRN
  Filled 2018-12-29: qty 1

## 2018-12-29 MED ORDER — LIDOCAINE 2% (20 MG/ML) 5 ML SYRINGE
INTRAMUSCULAR | Status: AC
  Filled 2018-12-29: qty 5

## 2018-12-29 MED ORDER — ONDANSETRON HCL 4 MG/2ML IJ SOLN
INTRAMUSCULAR | Status: DC | PRN
  Administered 2018-12-29: 4 mg via INTRAVENOUS

## 2018-12-29 MED ORDER — OXYCODONE-ACETAMINOPHEN 5-325 MG PO TABS
1.0000 | ORAL_TABLET | ORAL | Status: DC | PRN
  Administered 2018-12-29: 1 via ORAL
  Administered 2018-12-30 (×2): 2 via ORAL
  Administered 2018-12-30: 1 via ORAL
  Administered 2018-12-30 – 2018-12-31 (×4): 2 via ORAL
  Filled 2018-12-29: qty 1
  Filled 2018-12-29 (×6): qty 2
  Filled 2018-12-29: qty 1
  Filled 2018-12-29 (×2): qty 2

## 2018-12-29 MED ORDER — BUPIVACAINE HCL (PF) 0.25 % IJ SOLN
INTRAMUSCULAR | Status: DC | PRN
  Administered 2018-12-29: 20 mL

## 2018-12-29 MED ORDER — BUPIVACAINE LIPOSOME 1.3 % IJ SUSP
INTRAMUSCULAR | Status: DC | PRN
  Administered 2018-12-29: 20 mL

## 2018-12-29 MED ORDER — ACETAMINOPHEN 160 MG/5ML PO SOLN
325.0000 mg | ORAL | Status: DC | PRN
  Filled 2018-12-29: qty 20.3

## 2018-12-29 MED ORDER — ONDANSETRON HCL 4 MG/2ML IJ SOLN
4.0000 mg | Freq: Four times a day (QID) | INTRAMUSCULAR | Status: DC | PRN
  Filled 2018-12-29: qty 2

## 2018-12-29 MED ORDER — PROPOFOL 10 MG/ML IV BOLUS
INTRAVENOUS | Status: DC | PRN
  Administered 2018-12-29: 160 mg via INTRAVENOUS

## 2018-12-29 MED ORDER — DEXAMETHASONE SODIUM PHOSPHATE 10 MG/ML IJ SOLN
INTRAMUSCULAR | Status: AC
  Filled 2018-12-29: qty 1

## 2018-12-29 MED ORDER — ROCURONIUM BROMIDE 10 MG/ML (PF) SYRINGE
PREFILLED_SYRINGE | INTRAVENOUS | Status: DC | PRN
  Administered 2018-12-29: 50 mg via INTRAVENOUS

## 2018-12-29 MED ORDER — CEFAZOLIN SODIUM-DEXTROSE 2-4 GM/100ML-% IV SOLN
INTRAVENOUS | Status: AC
  Filled 2018-12-29: qty 100

## 2018-12-29 MED ORDER — ROCURONIUM BROMIDE 10 MG/ML (PF) SYRINGE
PREFILLED_SYRINGE | INTRAVENOUS | Status: AC
  Filled 2018-12-29: qty 10

## 2018-12-29 MED ORDER — SUGAMMADEX SODIUM 200 MG/2ML IV SOLN
INTRAVENOUS | Status: DC | PRN
  Administered 2018-12-29: 200 mg via INTRAVENOUS

## 2018-12-29 MED ORDER — OXYCODONE HCL 5 MG/5ML PO SOLN
5.0000 mg | Freq: Once | ORAL | Status: DC | PRN
  Filled 2018-12-29: qty 5

## 2018-12-29 MED ORDER — MEPERIDINE HCL 25 MG/ML IJ SOLN
6.2500 mg | INTRAMUSCULAR | Status: DC | PRN
  Filled 2018-12-29: qty 1

## 2018-12-29 SURGICAL SUPPLY — 38 items
ADH SKN CLS APL DERMABOND .7 (GAUZE/BANDAGES/DRESSINGS) ×1
CANISTER SUCT 3000ML PPV (MISCELLANEOUS) ×3 IMPLANT
CONT PATH 16OZ SNAP LID 3702 (MISCELLANEOUS) ×3 IMPLANT
COVER WAND RF STERILE (DRAPES) ×3 IMPLANT
DECANTER SPIKE VIAL GLASS SM (MISCELLANEOUS) IMPLANT
DERMABOND ADVANCED (GAUZE/BANDAGES/DRESSINGS) ×2
DERMABOND ADVANCED .7 DNX12 (GAUZE/BANDAGES/DRESSINGS) IMPLANT
DRAPE WARM FLUID 44X44 (DRAPES) IMPLANT
DRSG OPSITE POSTOP 4X10 (GAUZE/BANDAGES/DRESSINGS) ×3 IMPLANT
DURAPREP 26ML APPLICATOR (WOUND CARE) ×3 IMPLANT
GLOVE BIO SURGEON STRL SZ7.5 (GLOVE) ×3 IMPLANT
GLOVE BIOGEL PI IND STRL 7.0 (GLOVE) ×3 IMPLANT
GLOVE BIOGEL PI INDICATOR 7.0 (GLOVE) ×6
GOWN STRL REUS W/TWL LRG LVL3 (GOWN DISPOSABLE) ×3 IMPLANT
NEEDLE HYPO 22GX1.5 SAFETY (NEEDLE) ×3 IMPLANT
NS IRRIG 1000ML POUR BTL (IV SOLUTION) ×3 IMPLANT
PACK ABDOMINAL GYN (CUSTOM PROCEDURE TRAY) ×3 IMPLANT
PAD OB MATERNITY 4.3X12.25 (PERSONAL CARE ITEMS) ×3 IMPLANT
PROTECTOR NERVE ULNAR (MISCELLANEOUS) ×1 IMPLANT
SPONGE LAP 18X18 RF (DISPOSABLE) ×6 IMPLANT
STAPLER VISISTAT 35W (STAPLE) ×1 IMPLANT
SUT MNCRL AB 3-0 PS2 27 (SUTURE) ×3 IMPLANT
SUT MON AB 2-0 CT1 27 (SUTURE) ×3 IMPLANT
SUT MON AB 2-0 CT1 36 (SUTURE) ×2 IMPLANT
SUT MON AB-0 CT1 36 (SUTURE) ×6 IMPLANT
SUT PLAIN 2 0 XLH (SUTURE) IMPLANT
SUT PROLENE 0 CT 1 30 (SUTURE) IMPLANT
SUT VIC AB 0 CT1 18XCR BRD8 (SUTURE) ×3 IMPLANT
SUT VIC AB 0 CT1 8-18 (SUTURE) ×9
SUT VIC AB 3-0 PS1 18 (SUTURE)
SUT VIC AB 3-0 PS1 18X BRD (SUTURE) IMPLANT
SUT VIC AB 3-0 SH 27 (SUTURE) ×6
SUT VIC AB 3-0 SH 27X BRD (SUTURE) IMPLANT
SUT VIC AB 4-0 KS 27 (SUTURE) IMPLANT
SUT VICRYL 0 TIES 12 18 (SUTURE) ×6 IMPLANT
SYR CONTROL 10ML LL (SYRINGE) ×3 IMPLANT
TOWEL OR 17X26 10 PK STRL BLUE (TOWEL DISPOSABLE) ×4 IMPLANT
TRAY FOLEY W/BAG SLVR 14FR (SET/KITS/TRAYS/PACK) ×3 IMPLANT

## 2018-12-29 NOTE — Op Note (Signed)
NAMEAMILEY, [name] MEDICAL RECORD [id number] ACCOUNT [id number] DATE OF BIRTH:[birth year] FACILITY: [facility] LOCATION: [facility] PHYSICIAN:Berklee Battey J. Whitney Hipps, MD  OPERATIVE REPORT  DATE OF PROCEDURE:  12/29/2018  CHIEF COMPLAINT:  Menorrhagia with ____ and symptomatic fibroids for definitive therapy.  POSTOPERATIVE DIAGNOSIS:   Menorrhagia with ____ and symptomatic fibroids for definitive therapy, pelvic adhesions.  PROCEDURE:  Total abdominal supracervical hysterectomy, bilateral salpingectomy, lysis of left adnexal adhesions.  SURGEON:  Brien Few, MD  ASSISTANTGarwin Brothers.  ANESTHESIA:  Local, general.  ESTIMATED BLOOD LOSS:  546 mL  COMPLICATIONS:  None.  DRAINS:  Foley.  COUNTS:  Correct.  DISPOSITION:  The patient was taken to recovery in good condition.  BRIEF OPERATIVE NOTE:  After being apprised of the risks of anesthesia, infection, bleeding, and surrounding organs, possible need for repair, delayed versus immediate complications including bowel and bladder injury, possible need for repair, the  patient was brought to the operating room and administered general anesthetic without complications.  Prepped and draped in usual sterile fashion.  Foley catheter placed.  Exam under anesthesia revealed the uterine fundus to be at the umbilicus, slightly  cephalad to the umbilicus, but mobile.  A Pfannenstiel skin incision was made, carried down to fascia, which was nicked in midline opened transversely using Mayo scissors.  Rectus muscles dissected sharply and bluntly in the midline.  Peritoneum entered  sharply and uterus was exteriorized from the excision due to the large size and was delivered.  The round ligaments were bilaterally clamped, suture ligated and opened.  Retroperitoneal space was entered.  At this time, difficulty was noted with the  bladder flap.  There is a high density of vascularity in the midline of the bladder flap with tortuous varicosities coursing from  the uterocervical junction down to the vagina.  These are of course of questionable etiology.  At this time, the bladder  flap is sharply dissected just to the level of the top of his varicosities.  There is evidence of bleeding, which is addressed using a 0 Vicryl interrupted suture to obtain hemostasis.  Because of this high vascular area and the patient's normal Pap  smears, the decision was made at this time that we will proceed with supracervical hysterectomy.  The left and right tubes are undermined using monopolar cautery, dividing the tubes down to the level of the uterotubal junction.  The retroperitoneal space  was entered.  The tubo-ovarian ligament on the left was clamped and suture ligated x2.  The similar pedicle on the right was isolated and clamped and suture ligated x2.  The uterine vessels were skeletonized bilaterally, doubly clamped and suture  ligated x2.  At this time, the specimen is then truncated at the uterocervical junction, the ureter having been identified to be distal to the level of dissection during the entire procedure.  At this time, the internal cervical os was noted and was  cauterized using monopolar cautery and the cervical stump was oversewn using interrupted 0 Vicryl sutures.  Good hemostasis was noted.  Irrigation is accomplished.  At this time, there are 2 large simple cysts on the left ovary which are removed without  difficulty.  Minimal bleeding noted.  The adhesions as previously known were lysed during the course of the hysterectomy from the left adnexa to the left sigmoid colon area.  At this time, irrigation accomplished.  Good hemostasis was assured.  The  peritoneum was closed using a 2-0 Monocryl in a pursestring fashion.  Fascia closed using 0 Monocryl  suture in a continuous running fashion, 2-0 plain used to close the subcutaneous tissue and skin was closed using a 3-0 Monocryl after placement of a  dilute Exparel suture.  The patient tolerated the  procedure well, is awakened and transferred to recovery in good condition.  TN/NUANCE  D:12/29/2018 T:12/29/2018 JOB:[id number]

## 2018-12-29 NOTE — Progress Notes (Signed)
Patient seen and examined. Consent witnessed and signed. No changes noted. Update completed. BP 138/82   Pulse 85   Temp 98 F (36.7 C) (Oral)   Resp 18   Ht 5\' 7"  (1.702 m)   Wt 106.1 kg   LMP 12/09/2018   SpO2 100%   BMI 36.63 kg/m   CBC    Component Value Date/Time   WBC 5.4 12/26/2018 0850   RBC 4.12 12/26/2018 0850   HGB 12.5 12/26/2018 0850   HCT 39.0 12/26/2018 0850   PLT 311 12/26/2018 0850   MCV 94.7 12/26/2018 0850   MCH 30.3 12/26/2018 0850   MCHC 32.1 12/26/2018 0850   RDW 12.7 12/26/2018 0850   LYMPHSABS 1.2 04/09/2016 1025   MONOABS 0.5 04/09/2016 1025   EOSABS 0.2 04/09/2016 1025   BASOSABS 0.0 04/09/2016 1025

## 2018-12-29 NOTE — Anesthesia Procedure Notes (Signed)
Procedure Name: Intubation Date/Time: 12/29/2018 1:40 PM Performed by: Bonney Aid, CRNA Pre-anesthesia Checklist: Patient identified, Emergency Drugs available, Suction available and Patient being monitored Patient Re-evaluated:Patient Re-evaluated prior to induction Oxygen Delivery Method: Circle system utilized Preoxygenation: Pre-oxygenation with 100% oxygen Induction Type: IV induction Ventilation: Mask ventilation without difficulty Laryngoscope Size: Mac and 3 Grade View: Grade I Tube type: Oral Tube size: 7.0 mm Number of attempts: 1 Airway Equipment and Method: Stylet Placement Confirmation: ETT inserted through vocal cords under direct vision,  positive ETCO2 and breath sounds checked- equal and bilateral Secured at: 22 cm Tube secured with: Tape Dental Injury: Teeth and Oropharynx as per pre-operative assessment

## 2018-12-29 NOTE — Transfer of Care (Signed)
Immediate Anesthesia Transfer of Care Note  Patient: Whitney [name]  Procedure(s) Performed: HYSTERECTOMY ABDOMINAL WITH SALPINGECTOMY (Bilateral Abdomen)  Patient Location: PACU  Anesthesia Type:General  Level of Consciousness: sedated  Airway & Oxygen Therapy: Patient Spontanous Breathing and Patient connected to nasal cannula oxygen  Post-op Assessment: Report given to RN  Post vital signs: Reviewed and stable  Last Vitals:142/86, 83, 22, 100%  Vitals Value Taken Time  BP    Temp    Pulse 87 12/29/18 1548  Resp    SpO2 100 % 12/29/18 1548  Vitals shown include unvalidated device data.  Last Pain:  Vitals:   12/29/18 1112  TempSrc: Oral         Complications: No apparent anesthesia complications

## 2018-12-29 NOTE — Op Note (Signed)
12/29/2018  3:54 PM  PATIENT:  Whitney [name]  38 y.o. female  PRE-OPERATIVE DIAGNOSIS:  Symptomatic Uterine Fibroids  POST-OPERATIVE DIAGNOSIS:  Symptomatic Uterine Fibroids  PROCEDURE:  Procedure(s): SUPRACERVICAL HYSTERECTOMY ABDOMINAL WITH SALPINGECTOMY BILATERAL LEFT OVARIAN CYSTECTOMY  SURGEON:  Ronita Hipps, MD  ASSISTANTSGarwin Brothers, MD   ANESTHESIA:   local and general  ESTIMATED BLOOD LOSS: 250 mL   DRAINS: Urinary Catheter (Foley)   LOCAL MEDICATIONS USED:  MARCAINE    and Amount: 25 ml  SPECIMEN:  Source of Specimen:  uterus and tubes  DISPOSITION OF SPECIMEN:  PATHOLOGY  COUNTS:  YES  DICTATION #: [id number]  PLAN OF CARE: ADMIT  PATIENT DISPOSITION:  PACU - hemodynamically stable.

## 2018-12-29 NOTE — H&P (Addendum)
Whitney [name] is an 38 y.o. female. G3 with symptomatic fibroids for definitive therapy Failed OCPs and IUD.  Pertinent Gynecological History: Menses: flow is moderate Bleeding: dysfunctional uterine bleeding Contraception: none DES exposure: denies Blood transfusions: none Sexually transmitted diseases: no past history Previous GYN Procedures: DNC  Last mammogram: na Date: na Last pap: normal Date: 2020 OB History: G3, P3   Menstrual History: Menarche age: 33 Patient's last menstrual period was 12/09/2018.    Past Medical History:  Diagnosis Date  . Asthma   . Headache   . Heart murmur    was diagnosed at 1 yrs of age,but not found in adulthood  per pts PCP.  Marland Kitchen History of shingles   . UTI (lower urinary tract infection)     Past Surgical History:  Procedure Laterality Date  . BUNIONECTOMY Right    2015    Family History  Problem Relation Age of Onset  . Hypertension Mother   . Depression Mother   . Mental illness Mother   . Hypertension Father   . Drug abuse Father   . Diabetes Maternal Grandmother   . Stroke Maternal Grandmother   . Depression Brother   . Drug abuse Brother   . Learning disabilities Brother   . Mental illness Brother   . Alcohol abuse Maternal Grandfather   . Kidney disease Maternal Grandfather   . Drug abuse Paternal Grandmother   . Arthritis Paternal Grandfather   . Heart attack Paternal Grandfather   . Stroke Paternal Grandfather   . Heart disease Paternal Grandfather     Social History:  reports that she has never smoked. She has never used smokeless tobacco. She reports current alcohol use. She reports that she does not use drugs.  Allergies: No Known Allergies  No medications prior to admission.    Review of Systems  Constitutional: Negative.   All other systems reviewed and are negative.   Last menstrual period 12/09/2018. Physical Exam  Nursing note and vitals reviewed. Constitutional: She is oriented to person,  place, and time. She appears well-developed and well-nourished.  HENT:  Head: Normocephalic and atraumatic.  Neck: Normal range of motion. Neck supple.  Cardiovascular: Normal rate and regular rhythm.  Respiratory: Effort normal and breath sounds normal.  GI: Soft. Bowel sounds are normal.  Genitourinary:    Vagina and uterus normal.   Musculoskeletal: Normal range of motion.  Neurological: She is alert and oriented to person, place, and time. She has normal reflexes.  Skin: Skin is warm and dry.  Psychiatric: She has a normal mood and affect.    No results found for this or any previous visit (from the past 24 hour(s)).  No results found.  Assessment/Plan: Symptomatic fibroids for definitive therapy TAH , bilateral salpingectomy Surgical risks discussed. Consent done. Need for blood products discussed. Risks vs benefits noted.  Sterlin Knightly J 12/29/2018, 6:34 AM

## 2018-12-30 ENCOUNTER — Encounter (HOSPITAL_BASED_OUTPATIENT_CLINIC_OR_DEPARTMENT_OTHER): Payer: Self-pay | Admitting: Obstetrics and Gynecology

## 2018-12-30 LAB — CBC WITH DIFFERENTIAL/PLATELET
Abs Immature Granulocytes: 0.06 10*3/uL (ref 0.00–0.07)
Basophils Absolute: 0 10*3/uL (ref 0.0–0.1)
Basophils Relative: 0 %
Eosinophils Absolute: 0 10*3/uL (ref 0.0–0.5)
Eosinophils Relative: 0 %
HCT: 32.8 % — ABNORMAL LOW (ref 36.0–46.0)
Hemoglobin: 10.5 g/dL — ABNORMAL LOW (ref 12.0–15.0)
Immature Granulocytes: 1 %
Lymphocytes Relative: 9 %
Lymphs Abs: 0.9 10*3/uL (ref 0.7–4.0)
MCH: 30.1 pg (ref 26.0–34.0)
MCHC: 32 g/dL (ref 30.0–36.0)
MCV: 94 fL (ref 80.0–100.0)
Monocytes Absolute: 0.9 10*3/uL (ref 0.1–1.0)
Monocytes Relative: 9 %
Neutro Abs: 8.5 10*3/uL — ABNORMAL HIGH (ref 1.7–7.7)
Neutrophils Relative %: 81 %
Platelets: 284 10*3/uL (ref 150–400)
RBC: 3.49 MIL/uL — ABNORMAL LOW (ref 3.87–5.11)
RDW: 12.8 % (ref 11.5–15.5)
WBC: 10.5 10*3/uL (ref 4.0–10.5)
nRBC: 0 % (ref 0.0–0.2)

## 2018-12-30 LAB — COMPREHENSIVE METABOLIC PANEL
ALT: 13 U/L (ref 0–44)
AST: 15 U/L (ref 15–41)
Albumin: 3.6 g/dL (ref 3.5–5.0)
Alkaline Phosphatase: 28 U/L — ABNORMAL LOW (ref 38–126)
Anion gap: 8 (ref 5–15)
BUN: 9 mg/dL (ref 6–20)
CO2: 26 mmol/L (ref 22–32)
Calcium: 8.4 mg/dL — ABNORMAL LOW (ref 8.9–10.3)
Chloride: 102 mmol/L (ref 98–111)
Creatinine, Ser: 0.76 mg/dL (ref 0.44–1.00)
GFR calc Af Amer: >60 mL/min (ref >60)
GFR calc non Af Amer: >60 mL/min (ref >60)
Glucose, Bld: 118 mg/dL — ABNORMAL HIGH (ref 70–99)
Potassium: 3.8 mmol/L (ref 3.5–5.1)
Sodium: 136 mmol/L (ref 135–145)
Total Bilirubin: 0.5 mg/dL (ref 0.3–1.2)
Total Protein: 6.6 g/dL (ref 6.5–8.1)

## 2018-12-30 MED ORDER — TRAMADOL HCL 50 MG PO TABS: 100.0000 mg | ORAL_TABLET | Freq: Four times a day (QID) | ORAL | Status: DC | PRN

## 2018-12-30 NOTE — Progress Notes (Signed)
1 Day Post-Op Procedure(s) (LRB): HYSTERECTOMY ABDOMINAL WITH SALPINGECTOMY (Bilateral)  Subjective: Patient reports nausea, incisional pain, tolerating PO, + flatus and no problems voiding.    Objective: I have reviewed patient's vital signs, intake and output, medications and labs.  General: alert, cooperative and appears stated age Resp: clear to auscultation bilaterally and normal percussion bilaterally Cardio: regular rate and rhythm, S1, S2 normal, no murmur, click, rub or gallop GI: soft, non-tender; bowel sounds normal; no masses,  no organomegaly and incision: clean, dry and intact Extremities: extremities normal, atraumatic, no cyanosis or edema and Homans sign is negative, no sign of DVT Vaginal Bleeding: minimal  Assessment: s/p Procedure(s): HYSTERECTOMY ABDOMINAL WITH SALPINGECTOMY (Bilateral): stable and progressing well  Plan: Stable POD 1 Ck labs Advance diet Advance to PO medication Discontinue IV fluids  LOS: 1 day    Rudolph Daoust J 12/30/2018, 6:33 AM

## 2018-12-31 MED ORDER — OXYCODONE-ACETAMINOPHEN 5-325 MG PO TABS: 1.0000 | ORAL_TABLET | ORAL | 0 refills | Status: DC | PRN

## 2018-12-31 MED ORDER — TRAMADOL HCL 50 MG PO TABS: 100.0000 mg | ORAL_TABLET | Freq: Four times a day (QID) | ORAL | 0 refills | Status: DC | PRN

## 2018-12-31 NOTE — Progress Notes (Signed)
2 Days Post-Op Procedure(s) (LRB): HYSTERECTOMY ABDOMINAL WITH SALPINGECTOMY (Bilateral)  Subjective: Patient reports nausea, incisional pain, tolerating PO, + flatus and no problems voiding.    Objective: I have reviewed patient's vital signs, intake and output, medications and labs. BP 114/66 (BP Location: Left Arm)   Pulse 73   Temp 98.4 F (36.9 C) (Oral)   Resp 18   Ht 5\' 7"  (1.702 m)   Wt 106.1 kg   LMP 12/09/2018   SpO2 97%   BMI 36.63 kg/m   CBC    Component Value Date/Time   WBC 10.5 12/30/2018 0727   RBC 3.49 (L) 12/30/2018 0727   HGB 10.5 (L) 12/30/2018 0727   HCT 32.8 (L) 12/30/2018 0727   PLT 284 12/30/2018 0727   MCV 94.0 12/30/2018 0727   MCH 30.1 12/30/2018 0727   MCHC 32.0 12/30/2018 0727   RDW 12.8 12/30/2018 0727   LYMPHSABS 0.9 12/30/2018 0727   MONOABS 0.9 12/30/2018 0727   EOSABS 0.0 12/30/2018 0727   BASOSABS 0.0 12/30/2018 0727    General: alert, cooperative and appears stated age Resp: clear to auscultation bilaterally and normal percussion bilaterally Cardio: regular rate and rhythm, S1, S2 normal, no murmur, click, rub or gallop GI: soft, non-tender; bowel sounds normal; no masses,  no organomegaly and incision: clean, dry and intact Extremities: extremities normal, atraumatic, no cyanosis or edema and Homans sign is negative, no sign of DVT Vaginal Bleeding: minimal  Assessment: s/p Procedure(s): HYSTERECTOMY ABDOMINAL WITH SALPINGECTOMY (Bilateral): stable and progressing well  Plan: Stable POD 2 DC home  LOS: 2 days    Monee Dembeck J 12/31/2018, 9:08 AM

## 2018-12-31 NOTE — Plan of Care (Signed)
Reviewed discharge instructions with patient; copy given. IV removed. Patient ready for discharge.  

## 2018-12-31 NOTE — Plan of Care (Signed)
Patient lying in bed this morning; moderate pain currently. Requesting pain medication. Anticipating discharge today. Will continue to monitor.

## 2019-01-01 NOTE — Discharge Summary (Signed)
GYN Discharge Summary     Patient Name: Whitney [name] DOB: 1981-04-24 MRN: [id number]  Date of admission: 12/29/2018 Delivering MD: This patient has no babies on file.  Date of discharge: 01/01/2019  Admitting diagnosis: Symptomatic Uterine Fibroids      Secondary diagnosis:  Active Problems:   Uterine fibroid   Fibroids  Additional problems: none     Discharge diagnosis: Symptomatic fibroids                                                                                                Post operative procedures:na  Complications: none  Hospital course:  Uncomplicated post op course. Tolerated regular diet. PO pain meds adequate.   Physical exam  Vitals:   12/30/18 1014 12/30/18 1318 12/30/18 2154 12/31/18 0516  BP: 117/65 121/81 (!) 141/73 114/66  Pulse: 72 85 70 73  Resp: 17 17 18 18   Temp: 98.8 F (37.1 C) 99 F (37.2 C) 98.8 F (37.1 C) 98.4 F (36.9 C)  TempSrc: Oral Oral Oral Oral  SpO2: 100% 100% 100% 97%  Weight:      Height:       General: alert, cooperative and no distress: :  Incision: Healing well with no significant drainage, No significant erythema, Dressing is clean, dry, and intact DVT Evaluation: No evidence of DVT seen on physical exam. Negative Homan's sign. Labs: Lab Results  Component Value Date   WBC 10.5 12/30/2018   HGB 10.5 (L) 12/30/2018   HCT 32.8 (L) 12/30/2018   MCV 94.0 12/30/2018   PLT 284 12/30/2018   CMP Latest Ref Rng & Units 12/30/2018  Glucose 70 - 99 mg/dL 118(H)  BUN 6 - 20 mg/dL 9  Creatinine 0.44 - 1.00 mg/dL 0.76  Sodium 135 - 145 mmol/L 136  Potassium 3.5 - 5.1 mmol/L 3.8  Chloride 98 - 111 mmol/L 102  CO2 22 - 32 mmol/L 26  Calcium 8.9 - 10.3 mg/dL 8.4(L)  Total Protein 6.5 - 8.1 g/dL 6.6  Total Bilirubin 0.3 - 1.2 mg/dL 0.5  Alkaline Phos 38 - 126 U/L 28(L)  AST 15 - 41 U/L 15  ALT 0 - 44 U/L 13    Discharge instruction: instructions given  After visit meds:  Allergies as of 12/31/2018   No Known  Allergies     Medication List    TAKE these medications   escitalopram 10 MG tablet Commonly known as: LEXAPRO Take 1 tablet (10 mg total) by mouth daily.   metroNIDAZOLE 0.75 % vaginal gel Commonly known as: METROGEL VAGINAL Place 1 Applicatorful vaginally at bedtime.   MULTIVITAMINS PO Take 1 tablet by mouth daily.   oxyCODONE-acetaminophen 5-325 MG tablet Commonly known as: PERCOCET/ROXICET Take 1-2 tablets by mouth every 4 (four) hours as needed for severe pain.   traMADol 50 MG tablet Commonly known as: ULTRAM Take 2 tablets (100 mg total) by mouth every 6 (six) hours as needed for moderate pain.   valACYclovir 1000 MG tablet Commonly known as: VALTREX Take 1 g by mouth daily as needed.       Diet: routine diet  Activity: Advance as tolerated. Pelvic rest for 6 weeks.   Outpatient follow up:4 weeks Follow up Appt: Future Appointments  Date Time Provider [place]  01/05/2019  9:00 AM Nche, Charlene Brooke, NP [id number]   Follow up Visit:No follow-ups on file.      01/01/2019 Lovenia Kim, MD

## 2019-01-05 ENCOUNTER — Ambulatory Visit: Payer: Commercial Managed Care - PPO | Admitting: Nurse Practitioner

## 2019-01-10 NOTE — Anesthesia Postprocedure Evaluation (Signed)
Anesthesia Post Note  Patient: Whitney [name]  Procedure(s) Performed: HYSTERECTOMY ABDOMINAL WITH SALPINGECTOMY (Bilateral Abdomen)     Patient location during evaluation: PACU Anesthesia Type: General Level of consciousness: awake and alert Pain management: pain level controlled Vital Signs Assessment: post-procedure vital signs reviewed and stable Respiratory status: spontaneous breathing, nonlabored ventilation and respiratory function stable Cardiovascular status: blood pressure returned to baseline and stable Postop Assessment: no apparent nausea or vomiting Anesthetic complications: no    Last Vitals:  Vitals:   12/30/18 2154 12/31/18 0516  BP: (!) 141/73 114/66  Pulse: 70 73  Resp: 18 18  Temp: 37.1 C 36.9 C  SpO2: 100% 97%    Last Pain:  Vitals:   01/02/19 0924  TempSrc:   PainSc: 2    Pain Goal: Patients Stated Pain Goal: 2 (12/31/18 0818)                 Lidia Collum

## 2019-01-10 NOTE — Addendum Note (Signed)
Addendum  created 01/10/19 2134 by Lidia Collum, MD   Attestation recorded in [place], [place] filed

## 2019-02-02 ENCOUNTER — Other Ambulatory Visit: Payer: Self-pay

## 2019-02-03 ENCOUNTER — Encounter: Payer: Self-pay | Admitting: Nurse Practitioner

## 2019-02-03 ENCOUNTER — Ambulatory Visit (INDEPENDENT_AMBULATORY_CARE_PROVIDER_SITE_OTHER): Payer: Commercial Managed Care - PPO | Admitting: Nurse Practitioner

## 2019-02-03 ENCOUNTER — Other Ambulatory Visit: Payer: Self-pay

## 2019-02-03 VITALS — BP 116/74 | HR 77 | Temp 98.9°F | Ht 67.0 in | Wt 226.4 lb

## 2019-02-03 DIAGNOSIS — M25461 Effusion, right knee: Secondary | ICD-10-CM | POA: Diagnosis not present

## 2019-02-03 DIAGNOSIS — M25561 Pain in right knee: Secondary | ICD-10-CM

## 2019-02-03 MED ORDER — PREDNISONE 20 MG PO TABS: 40.0000 mg | ORAL_TABLET | Freq: Every day | ORAL | 0 refills | Status: DC

## 2019-02-03 NOTE — Patient Instructions (Addendum)
Take tramadol at least once a day. Take prednisone till complete.  Use cold compress and elevate right leg as much as possible. Use knee brace during the day and off at night.  Call office if no improvement in 1week. Ref to ortho  Acute Knee Pain, Adult Many things can cause knee pain. Sometimes, knee pain is sudden (acute) and may be caused by damage, swelling, or irritation of the muscles and tissues that support your knee. The pain often goes away on its own with time and rest. If the pain does not go away, tests may be done to find out what is causing the pain. Follow these instructions at home: Pay attention to any changes in your symptoms. Take these actions to relieve your pain. If you have a knee sleeve or brace:   Wear the sleeve or brace as told by your doctor. Remove it only as told by your doctor.  Loosen the sleeve or brace if your toes: ? Tingle. ? Become numb. ? Turn cold and blue.  Keep the sleeve or brace clean.  If the sleeve or brace is not waterproof: ? Do not let it get wet. ? Cover it with a watertight covering when you take a bath or shower. Activity  Rest your knee.  Do not do things that cause pain.  Avoid activities where both feet leave the ground at the same time (high-impact activities). Examples are running, jumping rope, and doing jumping jacks.  Work with a physical therapist to make a safe exercise program, as told by your doctor. Managing pain, stiffness, and swelling   If told, put ice on the knee: ? Put ice in a plastic bag. ? Place a towel between your skin and the bag. ? Leave the ice on for 20 minutes, 2-3 times a day.  If told, put pressure (compression) on your injured knee to control swelling, give support, and help with discomfort. Compression may be done with an elastic bandage. General instructions  Take all medicines only as told by your doctor.  Raise (elevate) your knee while you are sitting or lying down. Make sure  your knee is higher than your heart.  Sleep with a pillow under your knee.  Do not use any products that contain nicotine or tobacco. These include cigarettes, e-cigarettes, and chewing tobacco. These products may slow down healing. If you need help quitting, ask your doctor.  If you are overweight, work with your doctor and a food expert (dietitian) to set goals to lose weight. Being overweight can make your knee hurt more.  Keep all follow-up visits as told by your doctor. This is important. Contact a doctor if:  The knee pain does not stop.  The knee pain changes or gets worse.  You have a fever along with knee pain.  Your knee feels warm when you touch it.  Your knee gives out or locks up. Get help right away if:  Your knee swells, and the swelling gets worse.  You cannot move your knee.  You have very bad knee pain. Summary  Many things can cause knee pain. The pain often goes away on its own with time and rest.  Your doctor may do tests to find out the cause of the pain.  Pay attention to any changes in your symptoms. Relieve your pain with rest, medicines, light activity, and use of ice.  Get help right away if you cannot move your knee or your knee pain is very bad. This information  is not intended to replace advice given to you by your health care provider. Make sure you discuss any questions you have with your health care provider. Document Released: 08/28/2008 Document Revised: 11/11/2017 Document Reviewed: 11/11/2017 Elsevier Patient Education  2020 [employer].

## 2019-02-03 NOTE — Progress Notes (Signed)
Subjective:  Patient ID: Whitney [name], female    DOB: 13-Jan-1981  Age: 39 y.o. MRN: [id number]  CC: Knee Pain (pt is c/o of right knee pain,swelling/going on 2 wks/ice and knee sleev,elavate. )   Knee Pain  The incident occurred more than 1 week ago. The incident occurred at home. The injury mechanism was a twisting injury. The pain is present in the right knee. The quality of the pain is described as aching. The pain has been constant since onset. Associated symptoms include an inability to bear weight. Pertinent negatives include no loss of motion, loss of sensation, muscle weakness, numbness or tingling. The symptoms are aggravated by movement, palpation and weight bearing. She has tried immobilization, rest, NSAIDs and ice for the symptoms. The treatment provided mild relief.   Reviewed past Medical, Social and Family history today.  Outpatient Medications Prior to Visit  Medication Sig Dispense Refill  . IBUPROFEN PO Take by mouth.    . escitalopram (LEXAPRO) 10 MG tablet Take 1 tablet (10 mg total) by mouth daily. (Patient not taking: Reported on 02/03/2019) 90 tablet 1  . metroNIDAZOLE (METROGEL VAGINAL) 0.75 % vaginal gel Place 1 Applicatorful vaginally at bedtime. (Patient not taking: Reported on 12/19/2018) 70 g 0  . Multiple Vitamin (MULTIVITAMINS PO) Take 1 tablet by mouth daily.     Marland Kitchen oxyCODONE-acetaminophen (PERCOCET/ROXICET) 5-325 MG tablet Take 1-2 tablets by mouth every 4 (four) hours as needed for severe pain. (Patient not taking: Reported on 02/03/2019) 30 tablet 0  . traMADol (ULTRAM) 50 MG tablet Take 2 tablets (100 mg total) by mouth every 6 (six) hours as needed for moderate pain. (Patient not taking: Reported on 02/03/2019) 30 tablet 0  . valACYclovir (VALTREX) 1000 MG tablet Take 1 g by mouth daily as needed.     No facility-administered medications prior to visit.     ROS See HPI  Objective:  BP 116/74   Pulse 77   Temp 98.9 F (37.2 C) (Oral)   Ht 5\' 7"   (1.702 m)   Wt 226 lb 6.4 oz (102.7 kg)   SpO2 97%   BMI 35.46 kg/m   BP Readings from Last 3 Encounters:  02/03/19 116/74  12/31/18 114/66  12/26/18 138/79    Wt Readings from Last 3 Encounters:  02/03/19 226 lb 6.4 oz (102.7 kg)  12/29/18 233 lb 14.4 oz (106.1 kg)  12/26/18 233 lb 6.4 oz (105.9 kg)   Physical Exam Vitals signs reviewed.  Musculoskeletal:        General: Swelling, tenderness and signs of injury present. No deformity.     Right hip: Normal.     Right knee: She exhibits decreased range of motion, swelling and effusion. She exhibits no erythema, no LCL laxity and no MCL laxity. Tenderness found. No medial joint line, no lateral joint line and no patellar tendon tenderness noted.     Right ankle: Normal.     Right lower leg: Normal.     Right foot: Normal.  Skin:    Findings: No erythema or rash.  Neurological:     Mental Status: She is alert and oriented to person, place, and time.     Sensory: No sensory deficit.     Motor: No weakness.     Gait: Gait abnormal.    Lab Results  Component Value Date   WBC 10.5 12/30/2018   HGB 10.5 (L) 12/30/2018   HCT 32.8 (L) 12/30/2018   PLT 284 12/30/2018   GLUCOSE 118 (  H) 12/30/2018   CHOL 168 04/09/2016   TRIG 66.0 04/09/2016   HDL 47.30 04/09/2016   LDLCALC 107 (H) 04/09/2016   ALT 13 12/30/2018   AST 15 12/30/2018   NA 136 12/30/2018   K 3.8 12/30/2018   CL 102 12/30/2018   CREATININE 0.76 12/30/2018   BUN 9 12/30/2018   CO2 26 12/30/2018   TSH 1.45 04/09/2016   HGBA1C 5.1 04/09/2016    Assessment & Plan:   Whitney [name] was seen today for knee pain.  Diagnoses and all orders for this visit:  Pain and swelling of knee, right -     predniSONE (DELTASONE) 20 MG tablet; Take 2 tablets (40 mg total) by mouth daily with breakfast.   I am having Whitney [name] start on predniSONE. I am also having her maintain her escitalopram, Multiple Vitamin (MULTIVITAMINS PO), metroNIDAZOLE, valACYclovir, traMADol,  oxyCODONE-acetaminophen, and IBUPROFEN PO.  Meds ordered this encounter  Medications  . predniSONE (DELTASONE) 20 MG tablet    Sig: Take 2 tablets (40 mg total) by mouth daily with breakfast.    Dispense:  6 tablet    Refill:  0    Order Specific Question:   Supervising Provider    Answer:   Lucille Passy [[id number]]    Problem List Items Addressed This Visit    None    Visit Diagnoses    Pain and swelling of knee, right    -  Primary   Relevant Medications   predniSONE (DELTASONE) 20 MG tablet       Follow-up: Return if symptoms worsen or fail to improve.  Wilfred Lacy, NP

## 2019-03-13 ENCOUNTER — Encounter: Payer: Self-pay | Admitting: Nurse Practitioner

## 2019-03-13 LAB — HM MAMMOGRAPHY

## 2019-03-13 NOTE — Progress Notes (Signed)
Abstracted result and sent to scan  

## 2019-08-15 ENCOUNTER — Other Ambulatory Visit: Payer: Self-pay

## 2019-08-15 ENCOUNTER — Encounter: Payer: Self-pay | Admitting: Nurse Practitioner

## 2019-08-15 ENCOUNTER — Telehealth (INDEPENDENT_AMBULATORY_CARE_PROVIDER_SITE_OTHER): Payer: Commercial Managed Care - PPO | Admitting: Nurse Practitioner

## 2019-08-15 VITALS — Temp 97.9°F | Ht 67.0 in

## 2019-08-15 DIAGNOSIS — F4323 Adjustment disorder with mixed anxiety and depressed mood: Secondary | ICD-10-CM | POA: Diagnosis not present

## 2019-08-15 DIAGNOSIS — F5102 Adjustment insomnia: Secondary | ICD-10-CM | POA: Diagnosis not present

## 2019-08-15 DIAGNOSIS — J309 Allergic rhinitis, unspecified: Secondary | ICD-10-CM | POA: Diagnosis not present

## 2019-08-15 DIAGNOSIS — H8110 Benign paroxysmal vertigo, unspecified ear: Secondary | ICD-10-CM | POA: Diagnosis not present

## 2019-08-15 MED ORDER — FLUTICASONE PROPIONATE 50 MCG/ACT NA SUSP: 2.0000 | Freq: Every day | NASAL | 0 refills | Status: DC

## 2019-08-15 MED ORDER — LORATADINE 10 MG PO TABS: 5.0000 mg | ORAL_TABLET | Freq: Every day | ORAL | 0 refills | Status: DC

## 2019-08-15 NOTE — Patient Instructions (Signed)

## 2019-08-15 NOTE — Progress Notes (Signed)
Virtual Visit via Video Note  I connected with@ on 08/15/19 at  9:15 AM EST by a video enabled telemedicine application and verified that I am speaking with the correct person using two identifiers.  Location: Patient:Home Provider: Office Participants: patient and provider  I discussed the limitations of evaluation and management by telemedicine and the availability of in person appointments. I also discussed with the patient that there may be a patient responsible charge related to this service. The patient expressed understanding and agreed to proceed.  CC:pt c/o dizzieness since Sunday--went to urgent care and got med for it (meclizine?)--its help. pt stated dizziness occue all day and she wants to know th cause of this.   History of Present Illness: Dizziness This is a new problem. The current episode started in the past 7 days (Sunday Morning). Episode frequency: constant on Sunday and Monday. The problem has been resolved. Associated symptoms include congestion, nausea and vertigo. Pertinent negatives include no abdominal pain, anorexia, arthralgias, change in bowel habit, chest pain, chills, coughing, diaphoresis, fatigue, fever, headaches, joint swelling, myalgias, neck pain, numbness, rash, sore throat, swollen glands, urinary symptoms, visual change, vomiting or weakness. Associated symptoms comments: Report nasal congestion x 62months (not related to current episode of vertigo per patient). Denies any hearing loss, no diarrhea or ABD pain. Exacerbated by: head movement. Treatments tried: meclizine. The treatment provided significant relief.  denies any recent travel, no fever, no loss of taste or smell. Contact with COVID case, her COVID test results was negative on 08/14/2019 She was evaluated ay urgent clinic on Sunday: BP was 132/70   Reviewed medication list, problem list and previous lab results.  Observations/Objective: Physical Exam  Constitutional: She is oriented to  person, place, and time. No distress.  Pulmonary/Chest: Effort normal.  Neurological: She is alert and oriented to person, place, and time.  Vitals reviewed.  Assessment and Plan: Charletta was seen today for dizziness.  Diagnoses and all orders for this visit:  Allergic rhinitis, unspecified seasonality, unspecified trigger -     fluticasone (FLONASE) 50 MCG/ACT nasal spray; Place 2 sprays into both nostrils daily. -     loratadine (CLARITIN) 10 MG tablet; Take 0.5 tablets (5 mg total) by mouth daily.  Benign paroxysmal positional vertigo, unspecified laterality -     TSH; Future -     Basic metabolic panel; Future -     CBC; Future  Adjustment disorder with mixed anxiety and depressed mood  Adjustment insomnia    Follow Up Instructions: See avs Provided education about possible causes of vertigo and wways to manage symptoms.   I discussed the assessment and treatment plan with the patient. The patient was provided an opportunity to ask questions and all were answered. The patient agreed with the plan and demonstrated an understanding of the instructions.   The patient was advised to call back or seek an in-person evaluation if the symptoms worsen or if the condition fails to improve as anticipated.  Wilfred Lacy, NP

## 2019-08-15 NOTE — Assessment & Plan Note (Signed)
Reports she no longer needs lexapro rx. Mood improved significant after hysterectomy

## 2019-08-15 NOTE — Assessment & Plan Note (Signed)
Reports she no longer needs lexapro rx. Mood and sleep quality improved significant after hysterectomy

## 2019-08-18 ENCOUNTER — Other Ambulatory Visit: Payer: Commercial Managed Care - PPO

## 2019-08-24 ENCOUNTER — Other Ambulatory Visit: Payer: Self-pay

## 2019-08-24 ENCOUNTER — Other Ambulatory Visit (INDEPENDENT_AMBULATORY_CARE_PROVIDER_SITE_OTHER): Payer: Commercial Managed Care - PPO

## 2019-08-24 DIAGNOSIS — H8110 Benign paroxysmal vertigo, unspecified ear: Secondary | ICD-10-CM

## 2019-08-24 LAB — CBC
HCT: 37.2 % (ref 36.0–46.0)
Hemoglobin: 12.2 g/dL (ref 12.0–15.0)
MCHC: 32.9 g/dL (ref 30.0–36.0)
MCV: 91.9 fl (ref 78.0–100.0)
Platelets: 294 10*3/uL (ref 150.0–400.0)
RBC: 4.04 Mil/uL (ref 3.87–5.11)
RDW: 13.7 % (ref 11.5–15.5)
WBC: 4.5 10*3/uL (ref 4.0–10.5)

## 2019-08-24 LAB — BASIC METABOLIC PANEL
BUN: 14 mg/dL (ref 6–23)
CO2: 26 mEq/L (ref 19–32)
Calcium: 8.7 mg/dL (ref 8.4–10.5)
Chloride: 104 mEq/L (ref 96–112)
Creatinine, Ser: 0.74 mg/dL (ref 0.40–1.20)
GFR: 104.83 mL/min (ref >60.00)
Glucose, Bld: 104 mg/dL — ABNORMAL HIGH (ref 70–99)
Potassium: 4 mEq/L (ref 3.5–5.1)
Sodium: 137 mEq/L (ref 135–145)

## 2019-08-24 LAB — TSH: TSH: 0.95 u[IU]/mL (ref 0.35–4.50)

## 2019-11-14 HISTORY — PX: LIPOSUCTION: SHX10

## 2020-03-29 LAB — HM PAP SMEAR: HM Pap smear: NEGATIVE

## 2020-03-29 LAB — HM MAMMOGRAPHY

## 2020-04-01 ENCOUNTER — Other Ambulatory Visit: Payer: Self-pay

## 2020-04-01 ENCOUNTER — Ambulatory Visit (INDEPENDENT_AMBULATORY_CARE_PROVIDER_SITE_OTHER): Payer: Commercial Managed Care - PPO | Admitting: Nurse Practitioner

## 2020-04-01 ENCOUNTER — Ambulatory Visit (INDEPENDENT_AMBULATORY_CARE_PROVIDER_SITE_OTHER): Payer: Commercial Managed Care - PPO

## 2020-04-01 ENCOUNTER — Encounter: Payer: Self-pay | Admitting: Nurse Practitioner

## 2020-04-01 VITALS — BP 106/78 | HR 80 | Temp 97.4°F | Ht 67.0 in | Wt 225.0 lb

## 2020-04-01 DIAGNOSIS — M25461 Effusion, right knee: Secondary | ICD-10-CM

## 2020-04-01 DIAGNOSIS — M25561 Pain in right knee: Secondary | ICD-10-CM | POA: Diagnosis not present

## 2020-04-01 DIAGNOSIS — M1711 Unilateral primary osteoarthritis, right knee: Secondary | ICD-10-CM | POA: Diagnosis not present

## 2020-04-01 NOTE — Patient Instructions (Addendum)
Continue ibuprofen as needed for pain and swelling. Stop use of hinged knee brace Ok to switch to compression sleeve if needed.

## 2020-04-01 NOTE — Progress Notes (Signed)
Subjective:  Patient ID: Whitney [name], female    DOB: [birth year]  Age: 39 y.o. MRN: [id number]  CC: Acute Visit (Pt c/o swelling in right knee x3-4 days. Pt states she has noticed it pops when she is walking and Friday she felt a burning sensation and started wearing her knee brace. Pt states the brace does help some but it is causing some bruising. )  Knee Pain  There was no injury mechanism. The pain is present in the right knee. The quality of the pain is described as aching. The pain is severe. The pain has been improving since onset. Associated symptoms include an inability to bear weight. Pertinent negatives include no loss of motion, loss of sensation, muscle weakness, numbness or tingling. The symptoms are aggravated by movement, palpation and weight bearing. She has tried NSAIDs for the symptoms. The treatment provided moderate relief.   Wt Readings from Last 3 Encounters:  04/01/20 225 lb (102.1 kg)  02/03/19 226 lb 6.4 oz (102.7 kg)  12/29/18 233 lb 14.4 oz (106.1 kg)   Reviewed past Medical, Social and Family history today.  Outpatient Medications Prior to Visit  Medication Sig Dispense Refill  . fluticasone (FLONASE) 50 MCG/ACT nasal spray Place 2 sprays into both nostrils daily. 16 g 0  . IBUPROFEN PO Take by mouth.    . loratadine (CLARITIN) 10 MG tablet Take 0.5 tablets (5 mg total) by mouth daily. 30 tablet 0  . Multiple Vitamin (MULTIVITAMINS PO) Take 1 tablet by mouth daily.     . meclizine (ANTIVERT) 12.5 MG tablet  (Patient not taking: Reported on 04/01/2020)     No facility-administered medications prior to visit.    ROS See HPI  Objective:  BP 106/78 (BP Location: Left Arm, Patient Position: Sitting, Cuff Size: Large)   Pulse 80   Temp (!) 97.4 F (36.3 C) (Temporal)   Ht 5\' 7"  (1.702 m)   Wt 225 lb (102.1 kg)   SpO2 99%   BMI 35.24 kg/m   Physical Exam Vitals reviewed.  Constitutional:      Appearance: She is obese.  Cardiovascular:     Rate  and Rhythm: Normal rate and regular rhythm.  Pulmonary:     Effort: Pulmonary effort is normal.     Breath sounds: Normal breath sounds.  Musculoskeletal:     Right knee: Swelling, bony tenderness and crepitus present. No effusion or erythema. Decreased range of motion. Tenderness present over the medial joint line. No patellar tendon tenderness. Normal alignment and normal patellar mobility.  Neurological:     Mental Status: She is alert and oriented to person, place, and time.     Assessment & Plan:  This visit occurred during the SARS-CoV-2 public health emergency.  Safety protocols were in place, including screening questions prior to the visit, additional usage of staff PPE, and extensive cleaning of exam room while observing appropriate contact time as indicated for disinfecting solutions.   Whitney [name] was seen today for acute visit.  Diagnoses and all orders for this visit:  Pain and swelling of knee, right -     DG Knee Complete 4 Views Right -     Ambulatory referral to Sports Medicine  Osteoarthritis of right knee, unspecified osteoarthritis type -     Ambulatory referral to Sports Medicine  Continue ibuprofen as needed for pain and swelling. Stop use of hinged knee brace Ok to switch to compression sleeve if needed. Problem List Items Addressed This Visit  None    Visit Diagnoses    Pain and swelling of knee, right    -  Primary   Relevant Orders   DG Knee Complete 4 Views Right (Completed)   Ambulatory referral to Sports Medicine   Osteoarthritis of right knee, unspecified osteoarthritis type       Relevant Orders   Ambulatory referral to Sports Medicine      Follow-up: No follow-ups on file.  Wilfred Lacy, NP

## 2020-04-02 ENCOUNTER — Encounter: Payer: Self-pay | Admitting: Nurse Practitioner

## 2020-04-02 ENCOUNTER — Ambulatory Visit: Payer: Commercial Managed Care - PPO | Admitting: Nurse Practitioner

## 2020-04-05 ENCOUNTER — Ambulatory Visit: Payer: Self-pay

## 2020-04-05 ENCOUNTER — Ambulatory Visit (INDEPENDENT_AMBULATORY_CARE_PROVIDER_SITE_OTHER): Payer: Commercial Managed Care - PPO | Admitting: Family Medicine

## 2020-04-05 ENCOUNTER — Other Ambulatory Visit: Payer: Self-pay

## 2020-04-05 ENCOUNTER — Encounter: Payer: Self-pay | Admitting: Family Medicine

## 2020-04-05 VITALS — BP 106/78 | HR 76 | Ht 67.0 in | Wt 227.4 lb

## 2020-04-05 DIAGNOSIS — G8929 Other chronic pain: Secondary | ICD-10-CM | POA: Diagnosis not present

## 2020-04-05 DIAGNOSIS — M25561 Pain in right knee: Secondary | ICD-10-CM

## 2020-04-05 MED ORDER — PENNSAID 2 % EX SOLN: 1.0000 "application " | Freq: Two times a day (BID) | CUTANEOUS | 2 refills | Status: AC

## 2020-04-05 NOTE — Progress Notes (Signed)
Subjective:    I'm seeing this patient as a consultation for:  Whitney Lacy, NP. Note will be routed back to referring provider/PCP.  CC: R knee pain and swelling  I, Whitney [name], LAT, ATC, am serving as scribe for Dr. Lynne [name].  HPI: Pt is a 39 y/o female presenting w/ c/o chronic R knee pain and swelling that flared up on 03/29/20 w/ no known MOI.  She locates her pain to her R anterior knee.  She works in [employer] research (medical).  R knee swelling: yes R knee mechanical symptoms: yes Aggravating factors: weight-bearing/walking; squatting Treatments tried:  IBU; knee brace  Diagnostic testing: R knee XR- 04/01/20  Past medical history, Surgical history, Family history, Social history, Allergies, and medications have been entered into the medical record, reviewed.   Review of Systems: No new headache, visual changes, nausea, vomiting, diarrhea, constipation, dizziness, abdominal pain, skin rash, fevers, chills, night sweats, weight loss, swollen lymph nodes, body aches, joint swelling, muscle aches, chest pain, shortness of breath, mood changes, visual or auditory hallucinations.   Objective:    Vitals:   04/05/20 1028  BP: 106/78  Pulse: 76  SpO2: 96%   General: Well Developed, well nourished, and in no acute distress.  Neuro/Psych: Alert and oriented x3, extra-ocular muscles intact, able to move all 4 extremities, sensation grossly intact. Skin: Warm and dry, no rashes noted.  Respiratory: Not using accessory muscles, speaking in full sentences, trachea midline.  Cardiovascular: Pulses palpable, no extremity edema. Abdomen: Does not appear distended. MSK: Right knee mild swelling otherwise normal-appearing no erythema. Normal motion with minimal crepitation.  Diffusely tender medial and lateral joint line. Stable ligamentous exam. Intact strength. Negative Murray's test.  Lab and Radiology Results No results found for this or any previous visit (from the past 72 hour(s)). DG Knee  Complete 4 Views Right  Result Date: 04/02/2020 CLINICAL DATA:  Right knee pain and swelling. EXAM: RIGHT KNEE - COMPLETE 4+ VIEW COMPARISON:  No prior. FINDINGS: No acute bony or joint abnormality. No evidence of fracture or dislocation. Mild tricompartment degenerative change. No prominent effusion. IMPRESSION: Mild tricompartment degenerative change. No acute abnormality identified. Electronically Signed   By: Marcello Moores  Register   On: 04/02/2020 05:50  I, Whitney [name], personally (independently) visualized and performed the interpretation of the images attached in this note. Mild DJD no fractures.  Procedure: Real-time Ultrasound Guided Injection of right knee superior lateral patellar space Device: Philips Affiniti 50G Images permanently stored and available for review in PACS Ultrasound examination of knee prior to injection reveals trace joint effusion normal-appearing medial and lateral joint space.  No Baker's cyst. Verbal informed consent obtained.  Discussed risks and benefits of procedure. Warned about infection bleeding damage to structures skin hypopigmentation and fat atrophy among others. Patient expresses understanding and agreement Time-out conducted.   Noted no overlying erythema, induration, or other signs of local infection.   Skin prepped in a sterile fashion.   Local anesthesia: Topical Ethyl chloride.   With sterile technique and under real time ultrasound guidance:  40 mg of Kenalog and 2 mL of Marcaine injected into joint. Fluid seen entering the joint capsule.   Completed without difficulty   Pain immediately resolved suggesting accurate placement of the medication.   Advised to call if fevers/chills, erythema, induration, drainage, or persistent bleeding.   Images permanently stored and available for review in the ultrasound unit.  Impression: Technically successful ultrasound guided injection.    Impression and Recommendations:    Assessment  and Plan: 39 y.o.  female with right knee pain thought to be due to exacerbation of DJD.Marland Kitchen  X-ray only showed mild DJD.  Ultrasound was largely normal.  Plan for pensad and steroid injection.  Recheck back if not improving.  Discussed precautions.  PDMP not reviewed this encounter. Orders Placed This Encounter  Procedures  . [place] LIMITED JOINT SPACE STRUCTURES LOW LEFT(NO LINKED CHARGES)    Order Specific Question:   Reason for Exam (SYMPTOM  OR DIAGNOSIS REQUIRED)    Answer:   R knee pain    Order Specific Question:   Preferred imaging location?    Answer:   [facility]   Meds ordered this encounter  Medications  . Diclofenac Sodium (PENNSAID) 2 % SOLN    Sig: Place 1 application onto the skin 2 (two) times daily.    Dispense:  112 g    Refill:  2    Home Phone      [phone] Work Phone      [phone] Mobile          [phone]     Discussed warning signs or symptoms. Please see discharge instructions. Patient expresses understanding.   The above documentation has been reviewed and is accurate and complete Whitney [name], M.D.

## 2020-04-05 NOTE — Patient Instructions (Addendum)
Thank you for coming in today.  Call or go to the ER if you develop a large red swollen joint with extreme pain or oozing puss.    Pennsaid instructions: You have been given a sample/prescription for Pennsaid, a topical medication.     You are to apply this gel to your injured body part twice daily (morning and evening).   A little goes a long way so you can use about a pea-sized amount for each area.   Spread this small amount over the area into a thin film and let it dry.   Be sure that you do not rub the gel into your skin for more than 10 or 15 seconds otherwise it can irritate you skin.    Once you apply the gel, please do not put any other lotion or clothing in contact with that area for 30 minutes to allow the gel to absorb into your skin.   Some people are sensitive to the medication and can develop a sunburn-like rash.  If you have only mild symptoms it is okay to continue to use the medication but if you have any breakdown of your skin you should discontinue its use and please let us know.   If you have been written a prescription for Pennsaid, you will receive a pump bottle of this topical gel through a mail order pharmacy.  The instructions on the bottle will say to apply two pumps twice a day which may be too much gel for your particular area so use the pea-sized amount as your guide.  Instructions for Duexis, Pennsaid and Vimovo:  Your prescription will be filled through a participating [facility] mail order pharmacy.  You will receive a phone call or text from one of the participating pharmacies which can be located in any state in the [place].  You must communicate directly with them to have this medication filled.  When the pharmacy contacts you, they will need your mailing address (for shipment of the medication) andy they will need payment information if you have a copay (typically no more than $10). If you have not heard from them 2-3 days after your  appointment with Dr. Georgina Snell, contact [facility] directly at [phone].    Please use voltaren gel up to 4x daily for pain as needed.    Ok to resume activity.   Let me know if not better.

## 2020-08-12 ENCOUNTER — Other Ambulatory Visit: Payer: Self-pay

## 2020-08-12 ENCOUNTER — Encounter: Payer: Self-pay | Admitting: Nurse Practitioner

## 2020-08-12 ENCOUNTER — Ambulatory Visit (INDEPENDENT_AMBULATORY_CARE_PROVIDER_SITE_OTHER): Payer: Commercial Managed Care - PPO | Admitting: Nurse Practitioner

## 2020-08-12 VITALS — BP 126/74 | Temp 97.7°F | Ht 67.0 in | Wt 224.8 lb

## 2020-08-12 DIAGNOSIS — Z13220 Encounter for screening for lipoid disorders: Secondary | ICD-10-CM

## 2020-08-12 DIAGNOSIS — Z136 Encounter for screening for cardiovascular disorders: Secondary | ICD-10-CM | POA: Diagnosis not present

## 2020-08-12 DIAGNOSIS — F4323 Adjustment disorder with mixed anxiety and depressed mood: Secondary | ICD-10-CM

## 2020-08-12 DIAGNOSIS — Z0001 Encounter for general adult medical examination with abnormal findings: Secondary | ICD-10-CM | POA: Diagnosis not present

## 2020-08-12 DIAGNOSIS — M25461 Effusion, right knee: Secondary | ICD-10-CM

## 2020-08-12 DIAGNOSIS — F329 Major depressive disorder, single episode, unspecified: Secondary | ICD-10-CM | POA: Diagnosis not present

## 2020-08-12 DIAGNOSIS — M1711 Unilateral primary osteoarthritis, right knee: Secondary | ICD-10-CM | POA: Diagnosis not present

## 2020-08-12 DIAGNOSIS — F32A Depression, unspecified: Secondary | ICD-10-CM

## 2020-08-12 LAB — LIPID PANEL
Cholesterol: 171 mg/dL (ref 0–200)
HDL: 47.4 mg/dL (ref >39.00)
LDL Cholesterol: 111 mg/dL — ABNORMAL HIGH (ref 0–99)
NonHDL: 123.75
Total CHOL/HDL Ratio: 4
Triglycerides: 62 mg/dL (ref 0.0–149.0)
VLDL: 12.4 mg/dL (ref 0.0–40.0)

## 2020-08-12 LAB — CBC
HCT: 36.1 % (ref 36.0–46.0)
Hemoglobin: 12.2 g/dL (ref 12.0–15.0)
MCHC: 33.7 g/dL (ref 30.0–36.0)
MCV: 89.3 fl (ref 78.0–100.0)
Platelets: 297 10*3/uL (ref 150.0–400.0)
RBC: 4.05 Mil/uL (ref 3.87–5.11)
RDW: 14 % (ref 11.5–15.5)
WBC: 4.1 10*3/uL (ref 4.0–10.5)

## 2020-08-12 LAB — COMPREHENSIVE METABOLIC PANEL
ALT: 14 U/L (ref 0–35)
AST: 14 U/L (ref 0–37)
Albumin: 4.1 g/dL (ref 3.5–5.2)
Alkaline Phosphatase: 35 U/L — ABNORMAL LOW (ref 39–117)
BUN: 10 mg/dL (ref 6–23)
CO2: 28 mEq/L (ref 19–32)
Calcium: 9.3 mg/dL (ref 8.4–10.5)
Chloride: 104 mEq/L (ref 96–112)
Creatinine, Ser: 0.78 mg/dL (ref 0.40–1.20)
GFR: 94.21 mL/min (ref >60.00)
Glucose, Bld: 96 mg/dL (ref 70–99)
Potassium: 4.1 mEq/L (ref 3.5–5.1)
Sodium: 137 mEq/L (ref 135–145)
Total Bilirubin: 0.5 mg/dL (ref 0.2–1.2)
Total Protein: 7.3 g/dL (ref 6.0–8.3)

## 2020-08-12 LAB — TSH: TSH: 1.1 u[IU]/mL (ref 0.35–4.50)

## 2020-08-12 LAB — URIC ACID: Uric Acid, Serum: 4.2 mg/dL (ref 2.4–7.0)

## 2020-08-12 MED ORDER — FLUOXETINE HCL 20 MG PO TABS: ORAL_TABLET | ORAL | 5 refills | Status: DC

## 2020-08-12 NOTE — Progress Notes (Signed)
Subjective:    Patient ID: Whitney [name], female    DOB: Oct 20, 1980, 40 y.o.   MRN: [id number]  Patient presents today for CPE and eval of chronic condition  HPI Depressive disorder Worsening due to stress at home and work. Agreed to start fuoxetine and referral to psychologist No SI/HI. We discussed common side effects such as nausea, drowsiness and weight gain.  Also discussed rare but serious side effect of suicide ideation.  She is instructed to discontinue medication go directly to ED if this occurs.  Pt verbalizes understanding. F/up in 54month  Primary osteoarthritis of right knee Ongoing since 2021 Normal serum uric acid DG knee indicates: tricompartmental OA. Followed by sports medicine   Depression/Suicide: Depression screen [facility] 2/9 08/12/2020 08/30/2018 08/30/2018  Decreased Interest 2 2 3   Down, Depressed, Hopeless 2 1 3   PHQ - 2 Score 4 3 6   Altered sleeping 3 3 -  Tired, decreased energy 2 2 -  Change in appetite 1 2 -  Feeling bad or failure about yourself  2 1 -  Trouble concentrating 2 3 -  Moving slowly or fidgety/restless 1 0 -  Suicidal thoughts 2 0 -  PHQ-9 Score 17 14 -  Difficult doing work/chores Somewhat difficult - -   GAD 7 : Generalized Anxiety Score 08/12/2020 08/30/2018  Nervous, Anxious, on Edge 2 1  Control/stop worrying 2 0  Worry too much - different things 2 2  Trouble relaxing 2 0  Restless 2 0  Easily annoyed or irritable 3 3  Afraid - awful might happen 3 1  Total GAD 7 Score 16 7  Anxiety Difficulty Somewhat difficult -   Vision:will schedule  Dental:will schedule  Immunizations: (TDAP, Hep C screen, Pneumovax, Influenza, zoster)  Health Maintenance  Topic Date Due  . COVID-19 Vaccine (1) Never done  . Pap Smear  10/15/2019  . Flu Shot  09/12/2020*  . Tetanus Vaccine  04/09/2026  .  Hepatitis C: One time screening is recommended by Center for Disease Control  (CDC) for  adults born from [birth year] through 1967.   Completed  .  HIV Screening  Completed  . HPV Vaccine  Aged Out  *Topic was postponed. The date shown is not the original due date.   Diet:regular Exercise: none Weight:  Wt Readings from Last 3 Encounters:  08/12/20 224 lb 12.8 oz (102 kg)  04/05/20 227 lb 6.4 oz (103.1 kg)  04/01/20 225 lb (102.1 kg)   Fall Risk: Fall Risk  08/30/2018  Falls in the past year? 0   Medications and allergies reviewed with patient and updated if appropriate.  Patient Active Problem List   Diagnosis Date Noted  . Primary osteoarthritis of right knee 08/14/2020  . Benign paroxysmal positional vertigo 08/15/2019  . Uterine fibroid 12/29/2018  . Depressive disorder 09/12/2018  . Adjustment insomnia 09/12/2018  . Migraine headache 04/09/2016  . Hyperactivity 04/09/2016  . History of motion sickness 04/09/2016    Current Outpatient Medications on File Prior to Visit  Medication Sig Dispense Refill  . Diclofenac Sodium (PENNSAID) 2 % SOLN Place 1 application onto the skin 2 (two) times daily. 112 g 2  . valACYclovir (VALTREX) 1000 MG tablet Take 1,000 mg by mouth 2 (two) times daily.     No current facility-administered medications on file prior to visit.    Past Medical History:  Diagnosis Date  . Headache   . History of shingles   . UTI (lower urinary tract infection)  Past Surgical History:  Procedure Laterality Date  . BUNIONECTOMY Right    2015  . HYSTERECTOMY ABDOMINAL WITH SALPINGECTOMY Bilateral 12/29/2018   Procedure: HYSTERECTOMY ABDOMINAL WITH SALPINGECTOMY;  Surgeon: Brien Few, MD;  Location: [place];  Service: Gynecology;  Laterality: Bilateral;  . LIPOSUCTION  11/2019   and laser skin removal    Social History   Socioeconomic History  . Marital status: Divorced    Spouse name: Not on file  . Number of children: Not on file  . Years of education: Not on file  . Highest education level: Not on file  Occupational History  . Not on file  Tobacco Use  .  Smoking status: Never Smoker  . Smokeless tobacco: Never Used  Vaping Use  . Vaping Use: Never used  Substance and Sexual Activity  . Alcohol use: Yes    Comment: socially  . Drug use: No  . Sexual activity: Yes    Birth control/protection: Implant    Comment: Nexplanon inserted 02/2017  Other Topics Concern  . Not on file  Social History Narrative  . Not on file   Social Determinants of Health   Financial Resource Strain: Not on file  Food Insecurity: Not on file  Transportation Needs: Not on file  Physical Activity: Not on file  Stress: Not on file  Social Connections: Not on file    Family History  Problem Relation Age of Onset  . Hypertension Mother   . Depression Mother   . Mental illness Mother   . Cancer Mother   . Hypertension Father   . Drug abuse Father   . Diabetes Maternal Grandmother   . Stroke Maternal Grandmother   . Depression Brother   . Drug abuse Brother   . Learning disabilities Brother   . Mental illness Brother   . Alcohol abuse Maternal Grandfather   . Kidney disease Maternal Grandfather   . Drug abuse Paternal Grandmother   . Arthritis Paternal Grandfather   . Heart attack Paternal Grandfather   . Stroke Paternal Grandfather   . Heart disease Paternal Grandfather        Review of Systems  Constitutional: Negative for fever, malaise/fatigue and weight loss.  HENT: Negative for congestion and sore throat.   Eyes:       Negative for visual changes  Respiratory: Negative for cough and shortness of breath.   Cardiovascular: Negative for chest pain, palpitations and leg swelling.  Gastrointestinal: Negative for blood in stool, constipation, diarrhea and heartburn.  Genitourinary: Negative for dysuria, frequency and urgency.  Musculoskeletal: Negative for falls, joint pain and myalgias.  Skin: Negative for rash.  Neurological: Negative for dizziness, sensory change and headaches.  Endo/Heme/Allergies: Does not bruise/bleed easily.   Psychiatric/Behavioral: Negative for depression, substance abuse and suicidal ideas. The patient is not nervous/anxious.    Objective:   Vitals:   08/12/20 1302  BP: 126/74  Temp: 97.7 F (36.5 C)   Body mass index is 35.21 kg/m.  Physical Examination:  Physical Exam Vitals reviewed.  Constitutional:      General: She is not in acute distress.    Appearance: She is obese.  HENT:     Right Ear: External ear normal.     Left Ear: Ear canal and external ear normal.     Mouth/Throat:     Mouth: Oropharynx is clear and moist.  Eyes:     General: No scleral icterus.    Extraocular Movements: Extraocular movements intact and EOM normal.  Conjunctiva/sclera: Conjunctivae normal.  Neck:     Thyroid: No thyromegaly.  Cardiovascular:     Rate and Rhythm: Normal rate and regular rhythm.     Pulses: Normal pulses and intact distal pulses.     Heart sounds: Normal heart sounds.  Pulmonary:     Effort: Pulmonary effort is normal.     Breath sounds: Normal breath sounds.  Chest:     Chest wall: No tenderness.  Abdominal:     General: Bowel sounds are normal. There is no distension.     Palpations: Abdomen is soft.     Tenderness: There is no abdominal tenderness.  Genitourinary:    Comments: S/p hysterectomy No vaginal symptoms Musculoskeletal:        General: No tenderness or edema. Normal range of motion.     Cervical back: Normal range of motion and neck supple.  Lymphadenopathy:     Cervical: No cervical adenopathy.  Skin:    General: Skin is warm and dry.  Neurological:     Mental Status: She is alert and oriented to person, place, and time.  Psychiatric:        Judgment: Judgment normal.    ASSESSMENT and PLAN: This visit occurred during the SARS-CoV-2 public health emergency.  Safety protocols were in place, including screening questions prior to the visit, additional usage of staff PPE, and extensive cleaning of exam room while observing appropriate contact  time as indicated for disinfecting solutions.   Whitney [name] was seen today for annual exam.  Diagnoses and all orders for this visit:  Encounter for preventative adult health care exam with abnormal findings -     CBC -     Comprehensive metabolic panel -     Lipid panel  Encounter for lipid screening for cardiovascular disease -     Lipid panel  Depressive disorder -     TSH -     FLUoxetine (PROZAC) 20 MG tablet; 0.5tab daily x 2weeks, then 1tab daily continuously -     Ambulatory referral to Psychology  Primary osteoarthritis of right knee -     Uric acid     You will be contacted to schedule an appt with psychology. Schedule appt for annual mammogram in October Sign medical release to get records from GYN.  Problem List Items Addressed This Visit      Musculoskeletal and Integument   Primary osteoarthritis of right knee    Ongoing since 2021 Normal serum uric acid DG knee indicates: tricompartmental OA. Followed by sports medicine       Relevant Orders   Uric acid (Completed)     Other   Depressive disorder    Worsening due to stress at home and work. Agreed to start fuoxetine and referral to psychologist No SI/HI. We discussed common side effects such as nausea, drowsiness and weight gain.  Also discussed rare but serious side effect of suicide ideation.  She is instructed to discontinue medication go directly to ED if this occurs.  Pt verbalizes understanding. F/up in 55month      Relevant Medications   FLUoxetine (PROZAC) 20 MG tablet   Other Relevant Orders   TSH (Completed)   Ambulatory referral to Psychology    Other Visit Diagnoses    Encounter for preventative adult health care exam with abnormal findings    -  Primary   Relevant Orders   CBC (Completed)   Comprehensive metabolic panel (Completed)   Lipid panel (Completed)   Encounter for lipid screening  for cardiovascular disease       Relevant Orders   Lipid panel (Completed)      Follow up:  Return in about 4 weeks (around 09/09/2020) for Anxiety and depression (80mins, video or F2F).  Wilfred Lacy, NP

## 2020-08-12 NOTE — Patient Instructions (Addendum)
Start Fluoxetine 1/2 tablet once daily for 1 week and then increase to a full tablet once daily on week two as tolerated.   We discussed common side effects such as nausea, drowsiness and weight gain.  Also discussed rare but serious side effect of suicide ideation.  She is instructed to discontinue medication go directly to ED if this occurs.  Pt verbalizes understanding.   Plan follow up in 1 month to evaluate progress.    You will be contacted to schedule an appt with psychology.  Schedule appt for annual mammogram in October  Sign medical release to get records from GYN.  Go to lab for blood draw Preventive Care 64-48 Years Old, Female Preventive care refers to lifestyle choices and visits with your health care provider that can promote health and wellness. This includes:  A yearly physical exam. This is also called an annual wellness visit.  Regular dental and eye exams.  Immunizations.  Screening for certain conditions.  Healthy lifestyle choices, such as: ? Eating a healthy diet. ? Getting regular exercise. ? Not using drugs or products that contain nicotine and tobacco. ? Limiting alcohol use. What can I expect for my preventive care visit? Physical exam Your health care provider will check your:  Height and weight. These may be used to calculate your BMI (body mass index). BMI is a measurement that tells if you are at a healthy weight.  Heart rate and blood pressure.  Body temperature.  Skin for abnormal spots. Counseling Your health care provider may ask you questions about your:  Past medical problems.  Family's medical history.  Alcohol, tobacco, and drug use.  Emotional well-being.  Home life and relationship well-being.  Sexual activity.  Diet, exercise, and sleep habits.  Work and work Statistician.  Access to firearms.  Method of birth control.  Menstrual cycle.  Pregnancy history. What immunizations do I need? Vaccines are usually given  at various ages, according to a schedule. Your health care provider will recommend vaccines for you based on your age, medical history, and lifestyle or other factors, such as travel or where you work.   What tests do I need? Blood tests  Lipid and cholesterol levels. These may be checked every 5 years, or more often if you are over 38 years old.  Hepatitis C test.  Hepatitis B test. Screening  Lung cancer screening. You may have this screening every year starting at age 5 if you have a 30-pack-year history of smoking and currently smoke or have quit within the past 15 years.  Colorectal cancer screening. ? All adults should have this screening starting at age 110 and continuing until age 82. ? Your health care provider may recommend screening at age 80 if you are at increased risk. ? You will have tests every 1-10 years, depending on your results and the type of screening test.  Diabetes screening. ? This is done by checking your blood sugar (glucose) after you have not eaten for a while (fasting). ? You may have this done every 1-3 years.  Mammogram. ? This may be done every 1-2 years. ? Talk with your health care provider about when you should start having regular mammograms. This may depend on whether you have a family history of breast cancer.  BRCA-related cancer screening. This may be done if you have a family history of breast, ovarian, tubal, or peritoneal cancers.  Pelvic exam and Pap test. ? This may be done every 3 years starting at age  21. ? Starting at age 55, this may be done every 5 years if you have a Pap test in combination with an HPV test. Other tests  STD (sexually transmitted disease) testing, if you are at risk.  Bone density scan. This is done to screen for osteoporosis. You may have this scan if you are at high risk for osteoporosis. Talk with your health care provider about your test results, treatment options, and if necessary, the need for more  tests. Follow these instructions at home: Eating and drinking  Eat a diet that includes fresh fruits and vegetables, whole grains, lean protein, and low-fat dairy products.  Take vitamin and mineral supplements as recommended by your health care provider.  Do not drink alcohol if: ? Your health care provider tells you not to drink. ? You are pregnant, may be pregnant, or are planning to become pregnant.  If you drink alcohol: ? Limit how much you have to 0-1 drink a day. ? Be aware of how much alcohol is in your drink. In the [place] one drink equals one 12 oz bottle of beer (355 mL), one 5 oz glass of wine (148 mL), or one 1 oz glass of hard liquor (44 mL).   Lifestyle  Take daily care of your teeth and gums. Brush your teeth every morning and night with fluoride toothpaste. Floss one time each day.  Stay active. Exercise for at least 30 minutes 5 or more days each week.  Do not use any products that contain nicotine or tobacco, such as cigarettes, e-cigarettes, and chewing tobacco. If you need help quitting, ask your health care provider.  Do not use drugs.  If you are sexually active, practice safe sex. Use a condom or other form of protection to prevent STIs (sexually transmitted infections).  If you do not wish to become pregnant, use a form of birth control. If you plan to become pregnant, see your health care provider for a prepregnancy visit.  If told by your health care provider, take low-dose aspirin daily starting at age 55.  Find healthy ways to cope with stress, such as: ? Meditation, yoga, or listening to music. ? Journaling. ? Talking to a trusted person. ? Spending time with friends and family. Safety  Always wear your seat belt while driving or riding in a vehicle.  Do not drive: ? If you have been drinking alcohol. Do not ride with someone who has been drinking. ? When you are tired or distracted. ? While texting.  Wear a helmet and other protective  equipment during sports activities.  If you have firearms in your house, make sure you follow all gun safety procedures. What's next?  Visit your health care provider once a year for an annual wellness visit.  Ask your health care provider how often you should have your eyes and teeth checked.  Stay up to date on all vaccines. This information is not intended to replace advice given to you by your health care provider. Make sure you discuss any questions you have with your health care provider. Document Revised: 03/05/2020 Document Reviewed: 02/10/2018 Elsevier Patient Education  2021 [employer].

## 2020-08-14 DIAGNOSIS — M1711 Unilateral primary osteoarthritis, right knee: Secondary | ICD-10-CM | POA: Insufficient documentation

## 2020-08-14 NOTE — Assessment & Plan Note (Addendum)
Worsening due to stress at home and work. Agreed to start fuoxetine and referral to psychologist No SI/HI. We discussed common side effects such as nausea, drowsiness and weight gain.  Also discussed rare but serious side effect of suicide ideation.  She is instructed to discontinue medication go directly to ED if this occurs.  Pt verbalizes understanding. F/up in 69month

## 2020-08-14 NOTE — Assessment & Plan Note (Signed)
Ongoing since 2021 Normal serum uric acid DG knee indicates: tricompartmental OA. Followed by sports medicine

## 2020-08-18 ENCOUNTER — Encounter: Payer: Self-pay | Admitting: Nurse Practitioner

## 2020-09-03 ENCOUNTER — Other Ambulatory Visit: Payer: Self-pay | Admitting: Nurse Practitioner

## 2020-09-03 DIAGNOSIS — F329 Major depressive disorder, single episode, unspecified: Secondary | ICD-10-CM

## 2020-09-03 DIAGNOSIS — F32A Depression, unspecified: Secondary | ICD-10-CM

## 2020-09-12 ENCOUNTER — Encounter: Payer: Self-pay | Admitting: Nurse Practitioner

## 2020-09-13 ENCOUNTER — Ambulatory Visit (INDEPENDENT_AMBULATORY_CARE_PROVIDER_SITE_OTHER): Payer: Commercial Managed Care - PPO | Admitting: Nurse Practitioner

## 2020-09-13 ENCOUNTER — Encounter: Payer: Self-pay | Admitting: Nurse Practitioner

## 2020-09-13 ENCOUNTER — Other Ambulatory Visit: Payer: Self-pay

## 2020-09-13 ENCOUNTER — Telehealth: Payer: Self-pay | Admitting: Nurse Practitioner

## 2020-09-13 VITALS — BP 124/82 | HR 65 | Temp 97.4°F | Ht 67.0 in | Wt 225.4 lb

## 2020-09-13 DIAGNOSIS — F909 Attention-deficit hyperactivity disorder, unspecified type: Secondary | ICD-10-CM

## 2020-09-13 DIAGNOSIS — F329 Major depressive disorder, single episode, unspecified: Secondary | ICD-10-CM

## 2020-09-13 DIAGNOSIS — F32A Depression, unspecified: Secondary | ICD-10-CM

## 2020-09-13 MED ORDER — FLUOXETINE HCL 20 MG PO TABS
20.0000 mg | ORAL_TABLET | Freq: Every day | ORAL | 5 refills | Status: AC
Start: 2020-09-13 — End: ?

## 2020-09-13 NOTE — Assessment & Plan Note (Addendum)
Chronic, worse with increase anxiety and stress. Ongoing since middle school, but never evaluated. Reports flight of ideas, easily distracted, rapid speech, and able to complete task only if she has uses a planner.  Entered referral to psychiatry

## 2020-09-13 NOTE — Telephone Encounter (Signed)
LVM to [facility] tried to reach her several times with no success. Give her the number to call and schedule an appt with a therapist. [phone]
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
## 2020-09-13 NOTE — Patient Instructions (Signed)
Maintain current medications  Intimate Partner Violence Information Intimate partner violence, also called domestic abuse or relationship abuse, is a pattern of behaviors used by one partner to gain or maintain power and control over the other partner. Intimate partner violence can happen to women and men and can happen between people who are or were:  Married.  Dating.  Living together. What are the types of intimate partner violence? Intimate partner violence can involve physical, emotional, psychological, sexual, and economic abuse, or stalking by a current or former partner. Different types of abuse can occur at the same time within the same relationship.  Physical abuse. This includes rough handling, threats with a weapon, throwing objects, pushing, or hitting.  Emotional and psychological abuse. This includes verbal attacks, rejection, humiliation, intimidation, social isolation, or threats. Abuse may also include limiting contact with family and friends.  Sexual assault. Sexual assault is any unwanted sexual activity that occurs without clear permission (consent) from both people. This includes unwanted touching and sexual harassment.  Economic abuse. This includes controlling money, food, transportation, or other belongings.  Stalking. This involves such things as repeated, unwanted phone calls, e-mails, or text messages, or watching the victim from a distance. What are some warning signs of intimate partner violence? Physical signs  Bruises.  Broken bones.  Burns or cuts.  Physical pain.  Head injury. Emotional and psychological signs  Crying.  Depression.  Hopelessness.  Desperation.  Trouble sleeping.  Fear of the partner.  Anxiety.  Suicidal thoughts or behavior.  Antisocial behavior.  Low self-esteem.  Fear of intimacy.  Flashbacks. Sexual signs  Bruising, swelling, or bleeding of the genital or rectal area.  Signs of an STI, such as genital  sores, warts, or discharge coming from the genital area.  Pain in the genital area.  Unintended pregnancy.  Problems with pregnancy. What are common behaviors of those affected by intimate partner violence? Those affected by intimate partner violence may:  Be late to work or other events.  Not show up to places as promised.  Have to let their partner know where they are and who they are with.  Be isolated or kept from seeing friends or family.  Make comments about their partner's temper or behavior.  Make excuses for their partner.  Engage in high-risk sexual behaviors.  Use drugs or alcohol.  Have unhealthy eating behaviors. What are common feelings of those affected by intimate partner violence? Victims of intimate partner violence may feel that they:  Must be careful not to say or do things that trigger their partner's anger.  Cannot do anything right.  Deserve to be treated badly.  Overreact to their partner's behavior or temper.  Cannot trust their own feelings.  Cannot trust other people.  Are trapped.  May have their children taken away by their partner.  Are emotionally drained or numb.  Are in danger.  Might have to kill their partner to survive. Where can you get help? If you do not feel safe searching for help online at home, use a computer at a [employer] to access the Internet. Call 911 if you are in immediate danger or need medical help. Intimate partner violence hotlines and websites  The [employer] Violence Hotline. ? 24-hour phone hotline: 717-304-6678 (SAFE) or 323 154 7048 (TTY). ? Videophone: available Monday through Friday, 9 a.m. to 5 p.m. Call (604)118-7643. ? thehotline.org  The National Sexual Assault Hotline. ? 24-hour phone hotline: (732) 164-1775. ? safehelpline.org Shelters for victims of intimate partner violence If  you are a victim of intimate partner violence, there are resources to help you find a  temporary place for you and your children to live (shelter). The specific address of these shelters is often not known to the public. Police Report assaults, threats, and stalking to the police. Counselors and counseling centers Counseling can help you cope with difficult emotions and empower you to plan for your future safety. The topics you discuss with a counselor are private and confidential. Children of intimate partner violence victims also might need counseling to manage stress and anxiety.   The court system You can work with a Chief Executive Officer or an advocate to get legal protection against an abuser. Protection includes restraining orders and private addresses. Crimes against you, such as assault, can also be prosecuted through the courts. Laws vary by state. Follow these instructions at home:  Create a safety plan that includes ways to remain safe while you are in an abusive relationship, while you are planning to leave, or after you leave. This plan may be created by the victim alone or with assistance from the domestic violence hotline staff or local shelter staff. Your safety plan may include: ? How to cope with emotions. ? How to tell friends and family about the abuse. ? How to take legal action. ? How to create a safe home environment. ? How to keep your children safe. ? Emergency plans for life-threatening situations. Get help right away if you:  Feel like you are in immediate danger.  Feel like you may hurt yourself or others. If you ever feel like you may hurt yourself or others, or have thoughts about taking your own life, get help right away. You can go to your nearest emergency department or call:  Your local emergency services (911 in the [place]  A suicide crisis helpline, such as the [place] at 7042717803. This is open 24 hours a day. Summary  If you are a victim of intimate partner violence, there are resources to help you find a temporary  place for you and your children to live (shelter).  Create a safety plan that includes ways to remain safe while you are in an abusive relationship, while you are planning to leave, or after you leave. This information is not intended to replace advice given to you by your health care provider. Make sure you discuss any questions you have with your health care provider. Document Revised: 11/04/2018 Document Reviewed: 07/16/2017 Elsevier Patient Education  2021 [employer].

## 2022-04-17 IMAGING — DX DG KNEE COMPLETE 4+V*R*
4 series · 4 of 4 positions shown · non-contrast
Comparison: No prior.

CLINICAL DATA: Right knee pain and swelling.

EXAM:
RIGHT KNEE - COMPLETE 4+ VIEW

[knee ap]
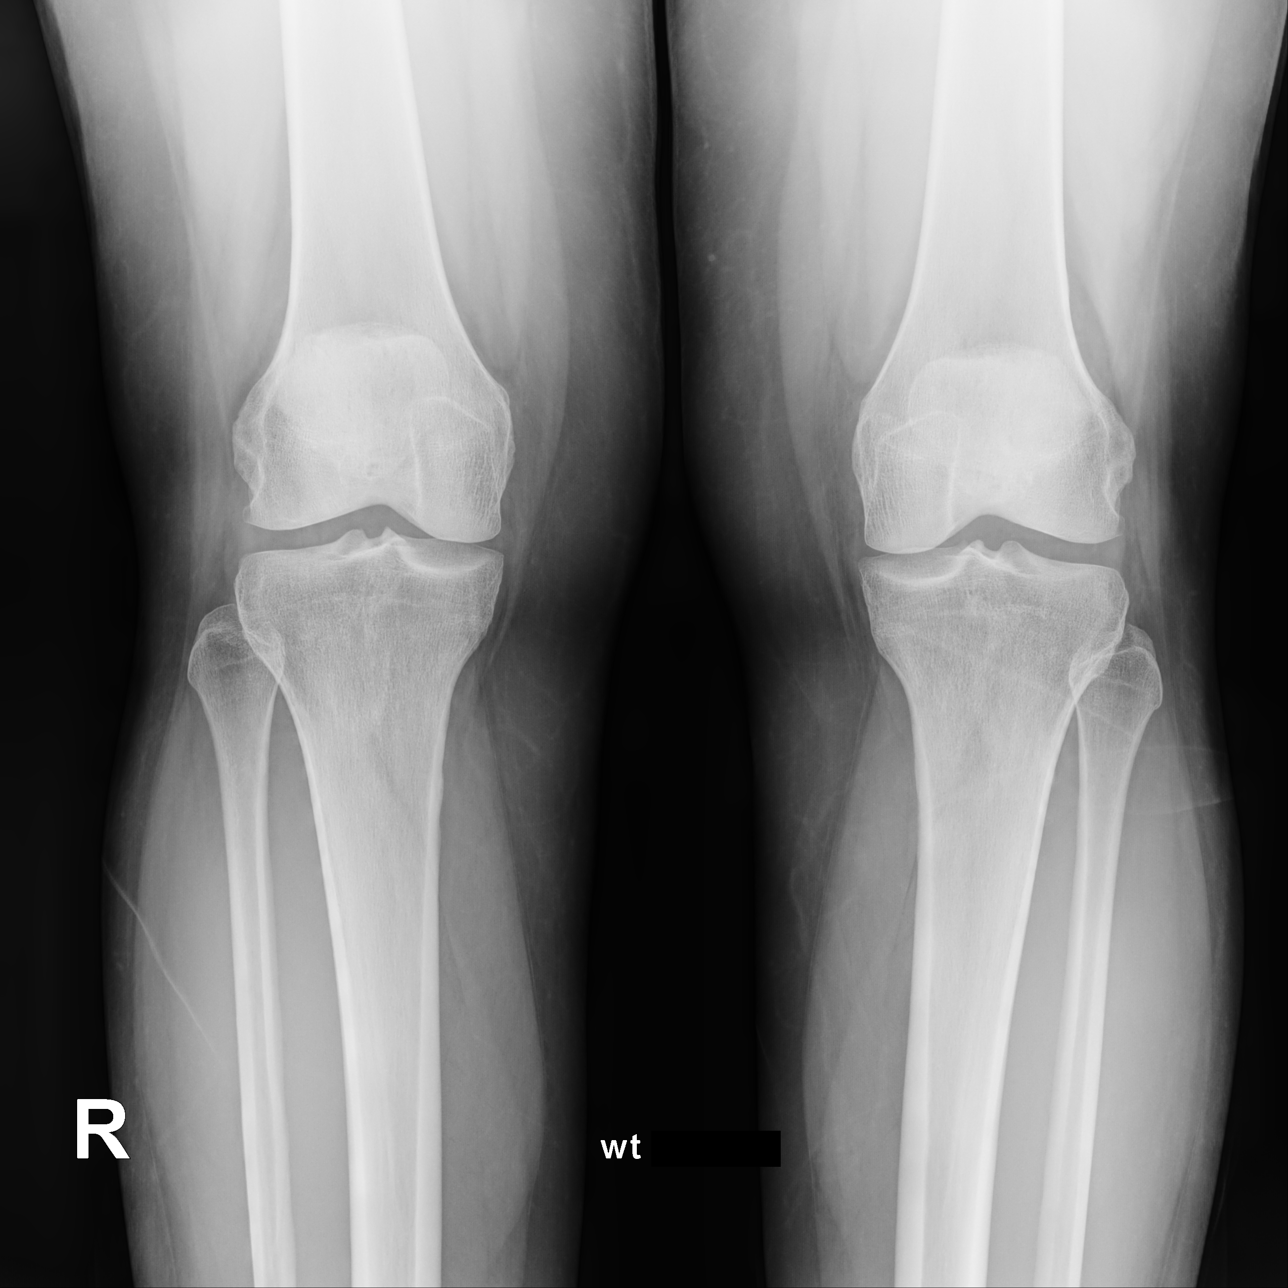

[knee [person_name] view pa]
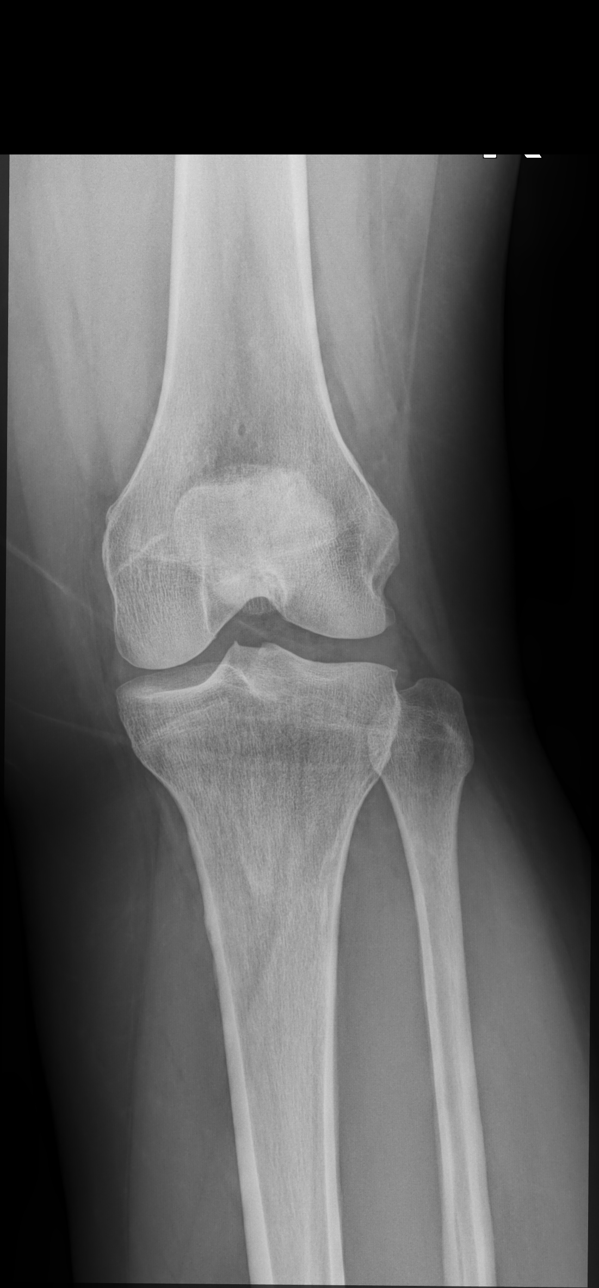

[knee lat]
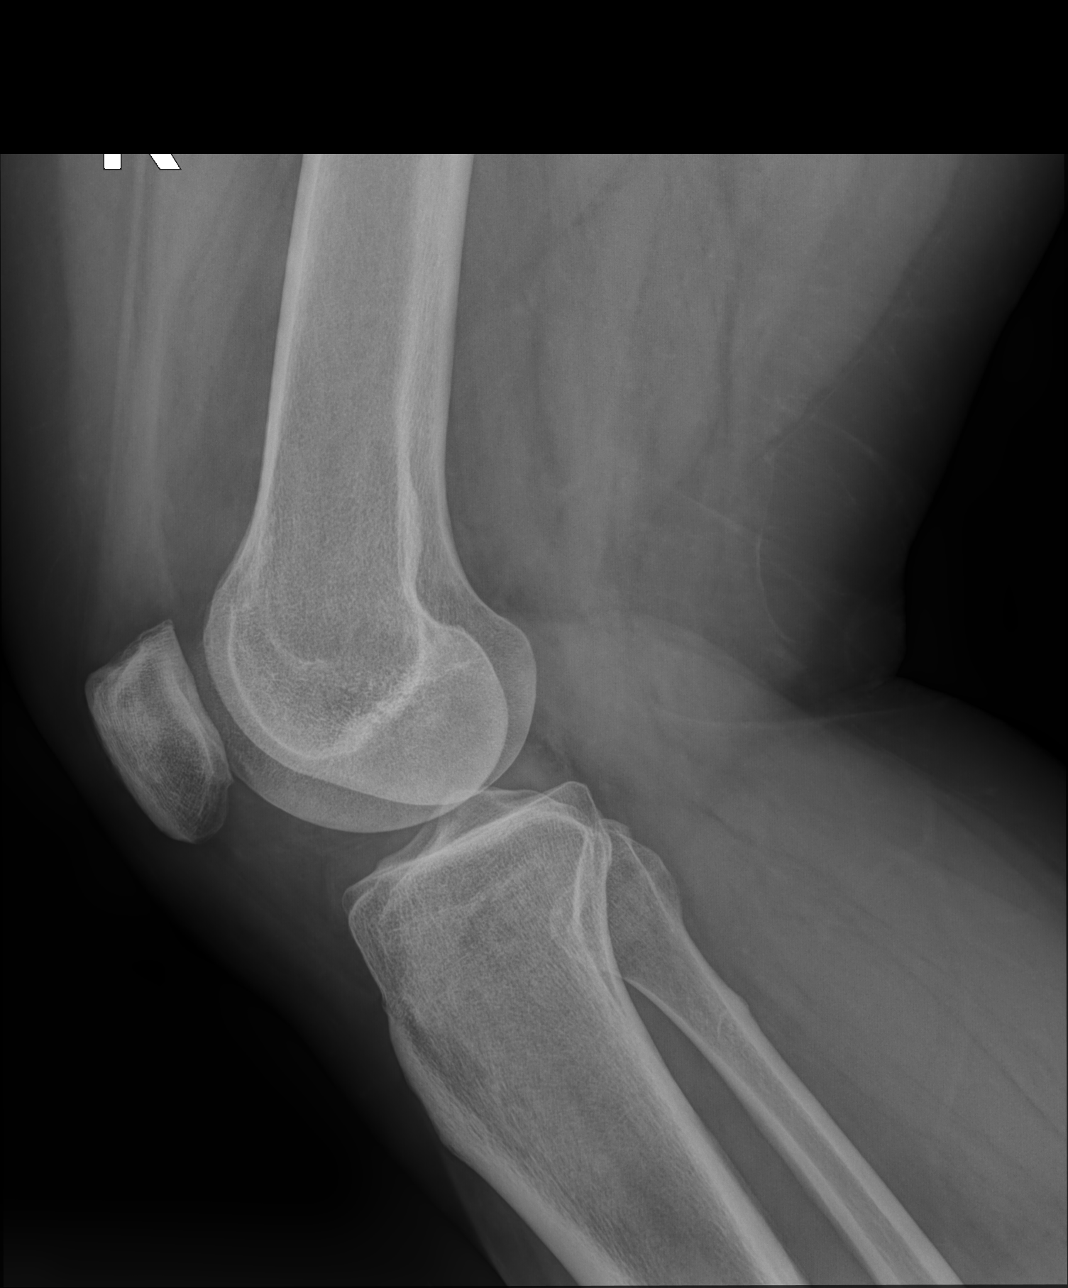

[patella (sunrise)]
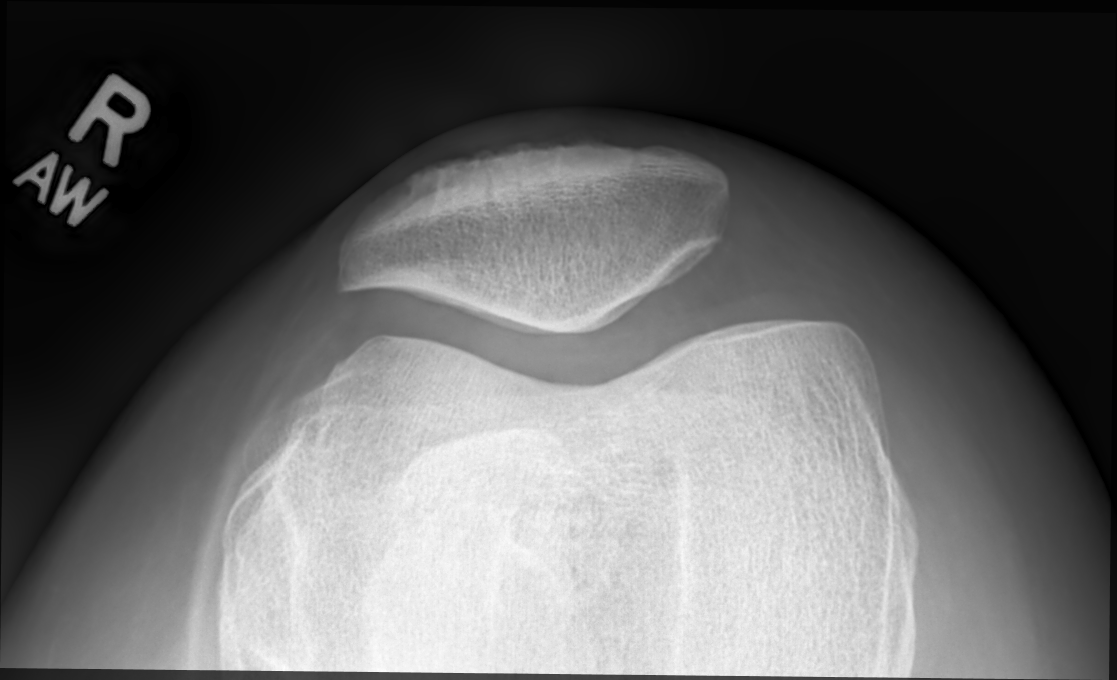

[4 of 4 positions shown; findings below may reference images not displayed]

FINDINGS: No acute bony or joint abnormality. No evidence of fracture or
dislocation. Mild tricompartment degenerative change. No prominent
effusion.
IMPRESSION: Mild tricompartment degenerative change. No acute abnormality
identified.

## 2022-06-02 ENCOUNTER — Telehealth: Payer: Self-pay | Admitting: Nurse Practitioner

## 2022-06-02 NOTE — Telephone Encounter (Signed)
[name] want to know if you received there fax from last week  . [name] can be reached at [phone]

## 2022-06-03 NOTE — Telephone Encounter (Signed)
Called [name] to advise we didn't receive anything. Left Detailed VM providing her with our fax number so she is able to resend the form if needed.

## 2022-06-16 NOTE — Telephone Encounter (Signed)
Form received, will complete and fax back to [facility]

## 2023-11-23 ENCOUNTER — Encounter: Payer: Self-pay | Admitting: Nurse Practitioner

## 2024-07-26 ENCOUNTER — Ambulatory Visit: Admitting: Internal Medicine
